# Patient Record
Sex: Female | Born: 1980 | Race: Black or African American | Hispanic: No | Marital: Married | Smoking: Never smoker
Health system: Southern US, Community
[De-identification: ages and names within clinical notes are randomized; demographics above are authoritative.]

## PROBLEM LIST (undated history)

## (undated) ENCOUNTER — Inpatient Hospital Stay (HOSPITAL_COMMUNITY): Payer: Self-pay

## (undated) DIAGNOSIS — O24419 Gestational diabetes mellitus in pregnancy, unspecified control: Secondary | ICD-10-CM

## (undated) HISTORY — DX: Gestational diabetes mellitus in pregnancy, unspecified control: O24.419

## (undated) HISTORY — PX: TUBAL LIGATION: SHX77

---

## 2009-02-01 ENCOUNTER — Ambulatory Visit (HOSPITAL_COMMUNITY): Admission: RE | Admit: 2009-02-01 | Discharge: 2009-02-01 | Payer: Self-pay | Admitting: Obstetrics & Gynecology

## 2009-04-20 ENCOUNTER — Ambulatory Visit (HOSPITAL_COMMUNITY): Admission: RE | Admit: 2009-04-20 | Discharge: 2009-04-20 | Payer: Self-pay | Admitting: Obstetrics & Gynecology

## 2009-06-12 ENCOUNTER — Inpatient Hospital Stay (HOSPITAL_COMMUNITY): Admission: AD | Admit: 2009-06-12 | Discharge: 2009-06-14 | Payer: Self-pay | Admitting: Obstetrics & Gynecology

## 2009-06-12 ENCOUNTER — Inpatient Hospital Stay (HOSPITAL_COMMUNITY): Admission: AD | Admit: 2009-06-12 | Discharge: 2009-06-12 | Payer: Self-pay | Admitting: Obstetrics & Gynecology

## 2010-08-21 ENCOUNTER — Encounter: Payer: Self-pay | Admitting: Obstetrics & Gynecology

## 2010-11-01 LAB — CBC
HCT: 33.7 % — ABNORMAL LOW (ref 36.0–46.0)
Hemoglobin: 12.6 g/dL (ref 12.0–15.0)
MCHC: 33.5 g/dL (ref 30.0–36.0)
MCV: 91.6 fL (ref 78.0–100.0)
RBC: 3.68 MIL/uL — ABNORMAL LOW (ref 3.87–5.11)
RBC: 4.13 MIL/uL (ref 3.87–5.11)
WBC: 11 10*3/uL — ABNORMAL HIGH (ref 4.0–10.5)

## 2011-07-31 NOTE — L&D Delivery Note (Signed)
Delivery Note At 11:50 AM a viable female was delivered via Vaginal, Spontaneous Delivery (Presentation: Right Occiput Anterior).  APGAR: 8, 9; weight 7 lb 14.3 oz (3580 g).   Placenta status: Intact, Spontaneous.  Cord: 3 vessels with the following complications: None.  Cord pH: none  Anesthesia: None  Episiotomy: Median Lacerations: 2nd Degree Suture Repair: 2-0 Chromic Est. Blood Loss (mL): 350  Mom to postpartum.  Baby to nursery-stable.  Aryiana Klinkner A 02/21/2012, 12:28 PM

## 2011-10-17 ENCOUNTER — Other Ambulatory Visit: Payer: Self-pay | Admitting: Obstetrics & Gynecology

## 2011-10-17 DIAGNOSIS — IMO0002 Reserved for concepts with insufficient information to code with codable children: Secondary | ICD-10-CM

## 2011-10-20 ENCOUNTER — Encounter: Payer: BC Managed Care – PPO | Admitting: Family Medicine

## 2011-10-20 NOTE — Progress Notes (Signed)
This encounter was created in error - please disregard.

## 2011-10-20 NOTE — Patient Instructions (Signed)

## 2011-10-20 NOTE — Progress Notes (Signed)
This 31 year old gentleman was seen for a physical exam yesterday and we noted a cyst on his right occiput. He's had this for about 9 months to year. It's nontender but bothersome and he would like it removed.  Objective: 2 cm round fluctuant elevated mass in the right occiput which is nontender.

## 2011-10-23 ENCOUNTER — Ambulatory Visit (HOSPITAL_COMMUNITY)
Admission: RE | Admit: 2011-10-23 | Discharge: 2011-10-23 | Disposition: A | Discharge status: Home / Self Care | Payer: Managed Care, Other (non HMO) | Source: Ambulatory Visit | Attending: Obstetrics & Gynecology | Admitting: Obstetrics & Gynecology

## 2011-10-23 ENCOUNTER — Encounter (HOSPITAL_COMMUNITY): Payer: Self-pay

## 2011-10-23 VITALS — BP 100/68 | HR 92 | Wt 147.0 lb

## 2011-10-23 DIAGNOSIS — Z363 Encounter for antenatal screening for malformations: Secondary | ICD-10-CM | POA: Insufficient documentation

## 2011-10-23 DIAGNOSIS — Z1389 Encounter for screening for other disorder: Secondary | ICD-10-CM | POA: Insufficient documentation

## 2011-10-23 DIAGNOSIS — O358XX0 Maternal care for other (suspected) fetal abnormality and damage, not applicable or unspecified: Secondary | ICD-10-CM | POA: Insufficient documentation

## 2011-10-23 DIAGNOSIS — IMO0002 Reserved for concepts with insufficient information to code with codable children: Secondary | ICD-10-CM

## 2011-10-23 NOTE — Progress Notes (Signed)
Obstetric ultrasound performed today.    The previously noted choriod plexus cysts appear to have resolved on today's exam.  This was discussed with the patient.  She did not have screening for fetal aneuploidy during this gestation.    Amniotic fluid volume is at the upper limits of normal.  No fetal findings associated with polyhydramnios are noted.   Fetal growth and the remainder of fetal anatomic survey are reassuring.    Repeat ultrasound recommended in 4 weeks to re evaluate fetal growth, anatomy, and amniotic fluid volume.  We would be pleased to perform this exam.  If desired, please call to schedule

## 2011-10-24 ENCOUNTER — Ambulatory Visit (HOSPITAL_COMMUNITY): Payer: Self-pay

## 2011-12-03 ENCOUNTER — Other Ambulatory Visit: Payer: Self-pay | Admitting: Obstetrics & Gynecology

## 2011-12-03 DIAGNOSIS — O358XX0 Maternal care for other (suspected) fetal abnormality and damage, not applicable or unspecified: Secondary | ICD-10-CM

## 2011-12-04 ENCOUNTER — Ambulatory Visit (HOSPITAL_COMMUNITY)
Admission: RE | Admit: 2011-12-04 | Discharge: 2011-12-04 | Disposition: A | Discharge status: Home / Self Care | Payer: BC Managed Care – PPO | Source: Ambulatory Visit | Attending: Obstetrics & Gynecology | Admitting: Obstetrics & Gynecology

## 2011-12-04 DIAGNOSIS — Z3689 Encounter for other specified antenatal screening: Secondary | ICD-10-CM | POA: Insufficient documentation

## 2011-12-04 DIAGNOSIS — O358XX0 Maternal care for other (suspected) fetal abnormality and damage, not applicable or unspecified: Secondary | ICD-10-CM

## 2011-12-28 ENCOUNTER — Other Ambulatory Visit: Payer: Self-pay | Admitting: Obstetrics & Gynecology

## 2011-12-28 DIAGNOSIS — Z09 Encounter for follow-up examination after completed treatment for conditions other than malignant neoplasm: Secondary | ICD-10-CM

## 2012-01-01 ENCOUNTER — Ambulatory Visit (HOSPITAL_COMMUNITY)
Admission: RE | Admit: 2012-01-01 | Discharge: 2012-01-01 | Disposition: A | Discharge status: Home / Self Care | Payer: BC Managed Care – PPO | Source: Ambulatory Visit | Attending: Obstetrics & Gynecology | Admitting: Obstetrics & Gynecology

## 2012-01-01 VITALS — BP 111/69 | HR 89 | Wt 158.0 lb

## 2012-01-01 DIAGNOSIS — Z3689 Encounter for other specified antenatal screening: Secondary | ICD-10-CM | POA: Insufficient documentation

## 2012-01-01 DIAGNOSIS — Z09 Encounter for follow-up examination after completed treatment for conditions other than malignant neoplasm: Secondary | ICD-10-CM

## 2012-01-01 DIAGNOSIS — O358XX0 Maternal care for other (suspected) fetal abnormality and damage, not applicable or unspecified: Secondary | ICD-10-CM | POA: Insufficient documentation

## 2012-01-01 NOTE — Progress Notes (Signed)
Patient seen today  for follow up ultrasound.  See full report in [facility].  Alpha Gula, MD  IUP at 33+2 weeks Appropriate interval growth with EFW at the 44th %tile   Normal amniotic fluid volume  Recommend follow up ultrasound as clinically indicated

## 2012-01-01 NOTE — ED Notes (Signed)
Pt denies problems to day. States + FM.

## 2012-02-21 ENCOUNTER — Encounter (HOSPITAL_COMMUNITY): Payer: Self-pay | Admitting: *Deleted

## 2012-02-21 ENCOUNTER — Inpatient Hospital Stay (HOSPITAL_COMMUNITY)
Admission: AD | Admit: 2012-02-21 | Discharge: 2012-02-23 | DRG: 373 | Disposition: A | Discharge status: Home / Self Care | Payer: BC Managed Care – PPO | Source: Ambulatory Visit | Attending: Obstetrics | Admitting: Obstetrics

## 2012-02-21 LAB — CBC
MCH: 28.3 pg (ref 26.0–34.0)
MCHC: 33.1 g/dL (ref 30.0–36.0)
MCV: 85.4 fL (ref 78.0–100.0)
Platelets: 189 10*3/uL (ref 150–400)
RDW: 15.7 % — ABNORMAL HIGH (ref 11.5–15.5)
WBC: 7.5 10*3/uL (ref 4.0–10.5)

## 2012-02-21 LAB — OB RESULTS CONSOLE GC/CHLAMYDIA
Chlamydia: NEGATIVE
Gonorrhea: NEGATIVE

## 2012-02-21 LAB — OB RESULTS CONSOLE ABO/RH: RH Type: POSITIVE

## 2012-02-21 LAB — ABO/RH: ABO/RH(D): O POS

## 2012-02-21 LAB — OB RESULTS CONSOLE RPR: RPR: NONREACTIVE

## 2012-02-21 MED ORDER — CITRIC ACID-SODIUM CITRATE 334-500 MG/5ML PO SOLN: 30.0000 mL | ORAL | Status: DC | PRN

## 2012-02-21 MED ORDER — MEDROXYPROGESTERONE ACETATE 150 MG/ML IM SUSP: 150.0000 mg | INTRAMUSCULAR | Status: DC | PRN

## 2012-02-21 MED ORDER — ZOLPIDEM TARTRATE 5 MG PO TABS: 5.0000 mg | ORAL_TABLET | Freq: Every evening | ORAL | Status: DC | PRN

## 2012-02-21 MED ORDER — ONDANSETRON HCL 4 MG/2ML IJ SOLN: 4.0000 mg | Freq: Four times a day (QID) | INTRAMUSCULAR | Status: DC | PRN

## 2012-02-21 MED ORDER — FLEET ENEMA 7-19 GM/118ML RE ENEM: 1.0000 | ENEMA | RECTAL | Status: DC | PRN

## 2012-02-21 MED ORDER — LIDOCAINE HCL (PF) 1 % IJ SOLN: 30.0000 mL | INTRAMUSCULAR | Status: DC | PRN

## 2012-02-21 MED ORDER — OXYTOCIN 40 UNITS IN LACTATED RINGERS INFUSION - SIMPLE MED
62.5000 mL/h | Freq: Once | INTRAVENOUS | Status: AC
  Administered 2012-02-21: 999 mL/h via INTRAVENOUS
  Filled 2012-02-21: qty 1000

## 2012-02-21 MED ORDER — OXYTOCIN 40 UNITS IN LACTATED RINGERS INFUSION - SIMPLE MED: 62.5000 mL/h | Freq: Once | INTRAVENOUS | Status: DC

## 2012-02-21 MED ORDER — LANOLIN HYDROUS EX OINT: TOPICAL_OINTMENT | CUTANEOUS | Status: DC | PRN

## 2012-02-21 MED ORDER — OXYTOCIN BOLUS FROM INFUSION: 250.0000 mL | Freq: Once | INTRAVENOUS | Status: DC

## 2012-02-21 MED ORDER — ONDANSETRON HCL 4 MG PO TABS: 4.0000 mg | ORAL_TABLET | ORAL | Status: DC | PRN

## 2012-02-21 MED ORDER — TETANUS-DIPHTH-ACELL PERTUSSIS 5-2.5-18.5 LF-MCG/0.5 IM SUSP
0.5000 mL | Freq: Once | INTRAMUSCULAR | Status: DC
  Filled 2012-02-21: qty 0.5

## 2012-02-21 MED ORDER — IBUPROFEN 600 MG PO TABS: 600.0000 mg | ORAL_TABLET | Freq: Four times a day (QID) | ORAL | Status: DC | PRN

## 2012-02-21 MED ORDER — OXYTOCIN BOLUS FROM INFUSION
250.0000 mL | Freq: Once | INTRAVENOUS | Status: DC
  Filled 2012-02-21: qty 500

## 2012-02-21 MED ORDER — ACETAMINOPHEN 325 MG PO TABS: 650.0000 mg | ORAL_TABLET | ORAL | Status: DC | PRN

## 2012-02-21 MED ORDER — NALBUPHINE SYRINGE 5 MG/0.5 ML
10.0000 mg | INJECTION | Freq: Four times a day (QID) | INTRAMUSCULAR | Status: DC | PRN
  Administered 2012-02-21: 10 mg via INTRAVENOUS
  Filled 2012-02-21 (×2): qty 1

## 2012-02-21 MED ORDER — LIDOCAINE HCL (PF) 1 % IJ SOLN
30.0000 mL | INTRAMUSCULAR | Status: DC | PRN
  Filled 2012-02-21: qty 30

## 2012-02-21 MED ORDER — NALOXONE HCL 0.4 MG/ML IJ SOLN
INTRAMUSCULAR | Status: AC
  Filled 2012-02-21: qty 1

## 2012-02-21 MED ORDER — NALBUPHINE HCL 10 MG/ML IJ SOLN
10.0000 mg | Freq: Four times a day (QID) | INTRAMUSCULAR | Status: DC | PRN
  Filled 2012-02-21: qty 1

## 2012-02-21 MED ORDER — NALBUPHINE SYRINGE 5 MG/0.5 ML
10.0000 mg | INJECTION | Freq: Four times a day (QID) | INTRAMUSCULAR | Status: DC | PRN
  Filled 2012-02-21: qty 1

## 2012-02-21 MED ORDER — LACTATED RINGERS IV SOLN: INTRAVENOUS | Status: DC

## 2012-02-21 MED ORDER — BENZOCAINE-MENTHOL 20-0.5 % EX AERO
1.0000 "application " | INHALATION_SPRAY | CUTANEOUS | Status: DC | PRN
  Administered 2012-02-21: 1 via TOPICAL
  Filled 2012-02-21: qty 56

## 2012-02-21 MED ORDER — LACTATED RINGERS IV SOLN: 500.0000 mL | INTRAVENOUS | Status: DC | PRN

## 2012-02-21 MED ORDER — NALBUPHINE HCL 10 MG/ML IJ SOLN
10.0000 mg | Freq: Four times a day (QID) | INTRAMUSCULAR | Status: DC | PRN
  Administered 2012-02-21: 10 mg via INTRAVENOUS
  Filled 2012-02-21 (×2): qty 1

## 2012-02-21 MED ORDER — DIBUCAINE 1 % RE OINT: 1.0000 "application " | TOPICAL_OINTMENT | RECTAL | Status: DC | PRN

## 2012-02-21 MED ORDER — WITCH HAZEL-GLYCERIN EX PADS: 1.0000 "application " | MEDICATED_PAD | CUTANEOUS | Status: DC | PRN

## 2012-02-21 MED ORDER — PRENATAL MULTIVITAMIN CH
1.0000 | ORAL_TABLET | Freq: Every day | ORAL | Status: DC
  Administered 2012-02-21 – 2012-02-23 (×3): 1 via ORAL
  Filled 2012-02-21 (×2): qty 1

## 2012-02-21 MED ORDER — OXYTOCIN 40 UNITS IN LACTATED RINGERS INFUSION - SIMPLE MED: 62.5000 mL/h | INTRAVENOUS | Status: DC | PRN

## 2012-02-21 MED ORDER — OXYCODONE-ACETAMINOPHEN 5-325 MG PO TABS
1.0000 | ORAL_TABLET | ORAL | Status: DC | PRN
Start: 2012-02-21 — End: 2012-02-21

## 2012-02-21 MED ORDER — DIPHENHYDRAMINE HCL 25 MG PO CAPS: 25.0000 mg | ORAL_CAPSULE | Freq: Four times a day (QID) | ORAL | Status: DC | PRN

## 2012-02-21 MED ORDER — OXYCODONE-ACETAMINOPHEN 5-325 MG PO TABS
1.0000 | ORAL_TABLET | ORAL | Status: DC | PRN
  Administered 2012-02-21 (×2): 1 via ORAL
  Administered 2012-02-22: 2 via ORAL
  Administered 2012-02-22 (×2): 1 via ORAL
  Filled 2012-02-21 (×2): qty 1
  Filled 2012-02-21: qty 2
  Filled 2012-02-21 (×2): qty 1

## 2012-02-21 MED ORDER — LACTATED RINGERS IV SOLN
INTRAVENOUS | Status: DC
  Administered 2012-02-21: 09:00:00 via INTRAVENOUS

## 2012-02-21 MED ORDER — SENNOSIDES-DOCUSATE SODIUM 8.6-50 MG PO TABS
2.0000 | ORAL_TABLET | Freq: Every day | ORAL | Status: DC
  Administered 2012-02-21 – 2012-02-22 (×2): 2 via ORAL

## 2012-02-21 MED ORDER — OXYCODONE-ACETAMINOPHEN 5-325 MG PO TABS: 1.0000 | ORAL_TABLET | ORAL | Status: DC | PRN

## 2012-02-21 MED ORDER — ONDANSETRON HCL 4 MG/2ML IJ SOLN: 4.0000 mg | INTRAMUSCULAR | Status: DC | PRN

## 2012-02-21 MED ORDER — IBUPROFEN 600 MG PO TABS
600.0000 mg | ORAL_TABLET | Freq: Four times a day (QID) | ORAL | Status: DC
  Administered 2012-02-21 – 2012-02-23 (×8): 600 mg via ORAL
  Filled 2012-02-21 (×8): qty 1

## 2012-02-21 MED ORDER — SIMETHICONE 80 MG PO CHEW: 80.0000 mg | CHEWABLE_TABLET | ORAL | Status: DC | PRN

## 2012-02-21 NOTE — Progress Notes (Signed)
Annette Lambert is a 31 y.o. [id number] at [redacted]w[redacted]d by LMP admitted for active labor  Subjective:   Objective: BP 111/70  Pulse 79  Temp 98.3 F (36.8 C) (Oral)  Resp 20  Ht 5\' 7"  (1.702 m)  Wt 74.753 kg (164 lb 12.8 oz)  BMI 25.81 kg/m2  SpO2 100%  LMP 05/13/2011      FHT:  FHR: 150 bpm, variability: moderate,  accelerations:  Present,  decelerations:  Absent UC:   regular, every 6 minutes SVE:   Dilation: 5 Effacement (%): 70 Station: -2 Exam by:: [employer]: Lab Results  Component Value Date   WBC 7.5 02/21/2012   HGB 11.2* 02/21/2012   HCT 33.8* 02/21/2012   MCV 85.4 02/21/2012   PLT 189 02/21/2012    Assessment / Plan: Spontaneous labor, progressing normally  Labor: Progressing normally Preeclampsia:  n/a Fetal Wellbeing:  Category I Pain Control:  Nubain I/D:  n/a Anticipated MOD:  NSVD  HARPER,CHARLES A 02/21/2012, 10:07 AM

## 2012-02-21 NOTE — Progress Notes (Signed)
Jerlean Peralta is a 31 y.o. [id number] at [redacted]w[redacted]d by LMP admitted for active labor  Subjective:   Objective: BP 104/56  Pulse 64  Temp 98.3 F (36.8 C) (Oral)  Resp 20  Ht 5\' 7"  (1.702 m)  Wt 74.753 kg (164 lb 12.8 oz)  BMI 25.81 kg/m2  SpO2 100%  LMP 05/13/2011      FHT:  FHR: 130 bpm, variability: moderate,  accelerations:  Present,  decelerations:  Present variables UC:   regular, every 2-3 minutes SVE:   Dilation: 10 Effacement (%): 70 Station: +1 Exam by:: Raliegh Ip RN  Labs: Lab Results  Component Value Date   WBC 7.5 02/21/2012   HGB 11.2* 02/21/2012   HCT 33.8* 02/21/2012   MCV 85.4 02/21/2012   PLT 189 02/21/2012    Assessment / Plan: Spontaneous labor, progressing normally  Labor: Progressing normally Preeclampsia:  n/a Fetal Wellbeing:  Category I Pain Control:  Nubain I/D:  n/a Anticipated MOD:  NSVD  Sitlali Koerner A 02/21/2012, 11:37 AM

## 2012-02-21 NOTE — H&P (Signed)
Annette Lambert is a 31 y.o. female presenting for UC's. Maternal Medical History:  Reason for admission: Reason for admission: contractions.  Contractions: Frequency: regular.    Fetal activity: Perceived fetal activity is normal.   Last perceived fetal movement was within the past hour.    Prenatal complications: no prenatal complications   OB History    Grav Para Term Preterm Abortions TAB SAB Ect Mult Living   4 2 2  0 1 0 1 0 0 2     Past Medical History  Diagnosis Date  . No pertinent past medical history    Past Surgical History  Procedure Date  . No past surgeries    Family History: family history is not on file. Social History:  reports that she has never smoked. She has never used smokeless tobacco. She reports that she does not drink alcohol or use illicit drugs.   Prenatal Transfer Tool  Maternal Diabetes: No Genetic Screening: Declined Maternal Ultrasounds/Referrals: Normal Fetal Ultrasounds or other Referrals:  None Maternal Substance Abuse:  No Significant Maternal Medications:  Meds include: Other: see prenatal record Significant Maternal Lab Results:  Lab values include: Group B Strep negative Other Comments:  None  Review of Systems  All other systems reviewed and are negative.    Dilation: 8 Effacement (%): 80 Station: -1 Exam by:: Lupita Leash, RN Blood pressure 117/66, pulse 85, temperature 97.2 F (36.2 C), temperature source Oral, resp. rate 18, height 5\' 7"  (1.702 m), weight 74.753 kg (164 lb 12.8 oz), last menstrual period 05/13/2011, SpO2 100.00%. Maternal Exam:  Uterine Assessment: Contraction strength is firm.  Abdomen: Patient reports no abdominal tenderness. Fetal presentation: vertex  Pelvis: adequate for delivery.   Cervix: Cervix evaluated by digital exam.     Physical Exam  Nursing note and vitals reviewed. Constitutional: She is oriented to person, place, and time. She appears well-developed and well-nourished.  HENT:  Head:  Normocephalic and atraumatic.  Eyes: Conjunctivae are normal. Pupils are equal, round, and reactive to light.  Neck: Normal range of motion. Neck supple.  Cardiovascular: Normal rate and regular rhythm.   Respiratory: Effort normal and breath sounds normal.  GI: Soft.  Musculoskeletal: Normal range of motion.  Neurological: She is alert and oriented to person, place, and time.  Skin: Skin is warm and dry.  Psychiatric: She has a normal mood and affect. Her behavior is normal. Judgment and thought content normal.    Prenatal labs: ABO, Rh:   Antibody:   Rubella:   RPR:    HBsAg:    HIV:    GBS:     Assessment/Plan: 40.4 weeks.  Active labor.  Expectant management.   Amelya Mabry A 02/21/2012, 8:33 AM

## 2012-02-22 LAB — CBC
Hemoglobin: 10.4 g/dL — ABNORMAL LOW (ref 12.0–15.0)
MCH: 28.4 pg (ref 26.0–34.0)
Platelets: 189 10*3/uL (ref 150–400)
RBC: 3.66 MIL/uL — ABNORMAL LOW (ref 3.87–5.11)
WBC: 10 10*3/uL (ref 4.0–10.5)

## 2012-02-22 NOTE — Progress Notes (Signed)
Post Partum Day 1 Subjective: no complaints  Objective: Blood pressure 94/65, pulse 74, temperature 97.4 F (36.3 C), temperature source Oral, resp. rate 18, height 5\' 7"  (1.702 m), weight 74.753 kg (164 lb 12.8 oz), last menstrual period 05/13/2011, SpO2 100.00%, unknown if currently breastfeeding.  Physical Exam:  General: alert and no distress Lochia: appropriate Uterine Fundus: firm Incision: none DVT Evaluation: No evidence of DVT seen on physical exam.   Basename 02/22/12 0500 02/21/12 0835  HGB 10.4* 11.2*  HCT 31.8* 33.8*    Assessment/Plan: Plan for discharge tomorrow   LOS: 1 day   Annette Lambert A 02/22/2012, 3:24 PM

## 2012-02-23 MED ORDER — OXYCODONE-ACETAMINOPHEN 5-325 MG PO TABS: 1.0000 | ORAL_TABLET | Freq: Four times a day (QID) | ORAL | Status: AC | PRN

## 2012-02-23 MED ORDER — MEDROXYPROGESTERONE ACETATE 150 MG/ML IM SUSP: 150.0000 mg | Freq: Once | INTRAMUSCULAR | Status: DC

## 2012-02-23 NOTE — Discharge Summary (Signed)
  Obstetric Discharge Summary Reason for Admission: onset of labor Prenatal Procedures: none Intrapartum Procedures: spontaneous vaginal delivery Postpartum Procedures: none Complications-Operative and Postpartum: none  Hemoglobin  Date Value Range Status  02/22/2012 10.4* 12.0 - 15.0 g/dL Final     HCT  Date Value Range Status  02/22/2012 31.8* 36.0 - 46.0 % Final    Physical Exam:  General: alert Lochia: appropriate Uterine: firm Incision: n/a DVT Evaluation: No evidence of DVT seen on physical exam.  Discharge Diagnoses: Active Problems:  Normal delivery   Discharge Information: Date: 02/23/2012 Activity: pelvic rest Diet: routine Medications:  Prior to Admission medications   Medication Sig Start Date End Date Taking? Authorizing Provider  Prenatal Vit-Fe Fumarate-FA (PRENATAL MULTIVITAMIN) TABS Take 1 tablet by mouth daily.   Yes Historical Provider, MD  oxyCODONE-acetaminophen (PERCOCET/ROXICET) 5-325 MG per tablet Take 1-2 tablets by mouth every 6 (six) hours as needed (moderate - severe pain). 02/23/12 03/04/12  Antionette Char, MD    Condition: stable Instructions: refer to routine discharge instructions Discharge to: home Follow-up Information    Follow up with Antionette Char A, MD. Schedule an appointment as soon as possible for a visit in 6 weeks.   Contact information:   [address] [place] [address] [phone]          Newborn Data: Live born  Information for the patient's newborn:  Laurianne, Floresca [[id number]]  female ; APGAR , ; weight ;  Home with mother.  JACKSON-MOORE,Valeria Krisko A 02/23/2012, 11:30 AM

## 2012-11-10 ENCOUNTER — Ambulatory Visit: Payer: Self-pay

## 2012-11-12 ENCOUNTER — Ambulatory Visit: Payer: Self-pay

## 2012-11-19 ENCOUNTER — Ambulatory Visit (INDEPENDENT_AMBULATORY_CARE_PROVIDER_SITE_OTHER): Payer: BC Managed Care – PPO | Admitting: *Deleted

## 2012-11-19 DIAGNOSIS — Z3202 Encounter for pregnancy test, result negative: Secondary | ICD-10-CM

## 2012-11-19 DIAGNOSIS — IMO0001 Reserved for inherently not codable concepts without codable children: Secondary | ICD-10-CM

## 2012-11-19 LAB — POCT URINE PREGNANCY: Preg Test, Ur: NEGATIVE

## 2012-11-19 NOTE — Progress Notes (Signed)
Patient is late for her injection- she was due 11/06/2012. UPT today and patient to abstain for 2 weeks with repeat and shot. Explained importance of being on time for injection.

## 2012-12-02 ENCOUNTER — Ambulatory Visit (INDEPENDENT_AMBULATORY_CARE_PROVIDER_SITE_OTHER): Payer: BC Managed Care – PPO | Admitting: *Deleted

## 2012-12-02 VITALS — BP 108/71 | HR 83 | Temp 98.7°F | Wt 151.0 lb

## 2012-12-02 DIAGNOSIS — Z309 Encounter for contraceptive management, unspecified: Secondary | ICD-10-CM

## 2012-12-02 DIAGNOSIS — Z3202 Encounter for pregnancy test, result negative: Secondary | ICD-10-CM

## 2012-12-02 LAB — POCT URINE PREGNANCY: Preg Test, Ur: NEGATIVE

## 2012-12-02 MED ORDER — MEDROXYPROGESTERONE ACETATE 150 MG/ML IM SUSP
150.0000 mg | INTRAMUSCULAR | Status: AC
  Administered 2012-12-02 – 2013-08-10 (×3): 150 mg via INTRAMUSCULAR

## 2012-12-02 NOTE — Progress Notes (Signed)
Pt in the office today for a second upt and the Depo injection. Pt states she has not had intercourse in the last two weeks.

## 2013-02-23 ENCOUNTER — Ambulatory Visit (INDEPENDENT_AMBULATORY_CARE_PROVIDER_SITE_OTHER): Payer: BC Managed Care – PPO | Admitting: *Deleted

## 2013-02-23 VITALS — BP 108/70 | HR 73 | Temp 98.5°F | Wt 159.0 lb

## 2013-02-23 DIAGNOSIS — Z3049 Encounter for surveillance of other contraceptives: Secondary | ICD-10-CM

## 2013-02-23 NOTE — Progress Notes (Signed)
Pt in office for Depo injection. Pt is on time for her Depo. Injection administered. Pt tolerated injection well. Pt is due back for her next injection 05-17-13.

## 2013-05-18 ENCOUNTER — Other Ambulatory Visit: Payer: Self-pay | Admitting: Obstetrics & Gynecology

## 2013-05-18 ENCOUNTER — Ambulatory Visit (INDEPENDENT_AMBULATORY_CARE_PROVIDER_SITE_OTHER): Payer: BC Managed Care – PPO | Admitting: *Deleted

## 2013-05-18 VITALS — BP 116/77 | HR 66 | Wt 161.0 lb

## 2013-05-18 DIAGNOSIS — Z3049 Encounter for surveillance of other contraceptives: Secondary | ICD-10-CM

## 2013-05-19 NOTE — Progress Notes (Signed)
Pt in office today for a Depo injection. Pt is one time for her injection. Pt to return to office 08-09-13. Pt tolerated injection well.

## 2013-08-10 ENCOUNTER — Ambulatory Visit (INDEPENDENT_AMBULATORY_CARE_PROVIDER_SITE_OTHER): Payer: 59 | Admitting: *Deleted

## 2013-08-10 ENCOUNTER — Other Ambulatory Visit: Payer: Self-pay | Admitting: *Deleted

## 2013-08-10 VITALS — BP 111/76 | HR 77 | Temp 98.1°F | Wt 158.6 lb

## 2013-08-10 DIAGNOSIS — Z309 Encounter for contraceptive management, unspecified: Secondary | ICD-10-CM

## 2013-08-10 DIAGNOSIS — IMO0001 Reserved for inherently not codable concepts without codable children: Secondary | ICD-10-CM

## 2013-08-10 MED ORDER — MEDROXYPROGESTERONE ACETATE 150 MG/ML IM SUSP: INTRAMUSCULAR | Status: DC

## 2013-08-12 ENCOUNTER — Ambulatory Visit: Payer: BC Managed Care – PPO

## 2013-10-05 ENCOUNTER — Ambulatory Visit: Payer: 59

## 2014-10-26 ENCOUNTER — Ambulatory Visit (INDEPENDENT_AMBULATORY_CARE_PROVIDER_SITE_OTHER): Payer: 59 | Admitting: Family Medicine

## 2014-10-26 VITALS — BP 102/60 | HR 105 | Temp 103.1°F | Resp 18 | Ht 67.25 in | Wt 153.0 lb

## 2014-10-26 DIAGNOSIS — R059 Cough, unspecified: Secondary | ICD-10-CM

## 2014-10-26 DIAGNOSIS — R509 Fever, unspecified: Secondary | ICD-10-CM

## 2014-10-26 DIAGNOSIS — R05 Cough: Secondary | ICD-10-CM | POA: Diagnosis not present

## 2014-10-26 DIAGNOSIS — J101 Influenza due to other identified influenza virus with other respiratory manifestations: Secondary | ICD-10-CM | POA: Diagnosis not present

## 2014-10-26 LAB — POCT INFLUENZA A/B
Influenza A, POC: POSITIVE
Influenza B, POC: NEGATIVE

## 2014-10-26 MED ORDER — OSELTAMIVIR PHOSPHATE 75 MG PO CAPS: 75.0000 mg | ORAL_CAPSULE | Freq: Two times a day (BID) | ORAL | Status: DC

## 2014-10-26 NOTE — Progress Notes (Addendum)
Subjective:   This chart was scribed for Annette StaggersJeffrey Jason Hauge, MD by Jarvis Morganaylor Ferguson, Medical Scribe. This patient was seen in Room 4 and the patient's care was started at 9:05 PM.   Patient ID: Annette MooreSahar Lambert, female    DOB: 09/07/1980, 34 y.o.   MRN: [id number]  Chief Complaint  Patient presents with  . Chills    started yesterday  . Fever  . Headache  . Cough    HPI  Annette MooreSahar Burress is a 34 y.o. female   She presents with intermittent chills that began yesterday. She is having associated fever (t-max 103.69F), cough, myalgias, HA. She denies getting a flu vaccine this year. She took Ibuprofen and Aleve with no relief. She denies any recent travel or sick contacts. Pt last urinated 2 hours ago. She denies any other issues at this time.   PCP: No primary care provider on file.  Patient Active Problem List   Diagnosis Date Noted  . Normal delivery 02/23/2012   Past Medical History  Diagnosis Date  . No pertinent past medical history    Past Surgical History  Procedure Laterality Date  . No past surgeries     No Known Allergies Prior to Admission medications   Medication Sig Start Date End Date Taking? Authorizing Provider  medroxyPROGESTERone (DEPO-PROVERA) 150 MG/ML injection Patient to bring to office for injection. 08/10/13  Yes Antionette CharLisa Jackson-Moore, MD   History   Social History  . Marital Status: Married    Spouse Name: N/A  . Number of Children: N/A  . Years of Education: N/A   Occupational History  . Not on file.   Social History Main Topics  . Smoking status: Never Smoker   . Smokeless tobacco: Never Used  . Alcohol Use: No  . Drug Use: No  . Sexual Activity: Yes    Birth Control/ Protection: Injection   Other Topics Concern  . Not on file   Social History Narrative    Review of Systems  Constitutional: Positive for fever and chills.  Respiratory: Positive for cough.   Genitourinary: Negative.   Musculoskeletal: Positive for myalgias.  Neurological:  Positive for headaches.       Objective:   Physical Exam  Constitutional: She is oriented to person, place, and time. She appears well-developed and well-nourished. No distress.  HENT:  Head: Normocephalic and atraumatic.  Right Ear: Hearing, tympanic membrane, external ear and ear canal normal.  Left Ear: Hearing, tympanic membrane, external ear and ear canal normal.  Nose: Nose normal.  Mouth/Throat: Oropharynx is clear and moist and mucous membranes are normal. No oropharyngeal exudate.  Moist oral mucosa  Eyes: Conjunctivae and EOM are normal. Pupils are equal, round, and reactive to light.  Cardiovascular: Normal rate, regular rhythm, normal heart sounds and intact distal pulses.   No murmur heard. Pulmonary/Chest: Effort normal and breath sounds normal. No respiratory distress. She has no wheezes. She has no rhonchi.  Neurological: She is alert and oriented to person, place, and time.  Skin: Skin is warm and dry. No rash noted.  Psychiatric: She has a normal mood and affect. Her behavior is normal.  Vitals reviewed.   Filed Vitals:   10/26/14 2031  BP: 102/60  Pulse: 105  Temp: 103.1 F (39.5 C)  TempSrc: Oral  Resp: 18  Height: 5' 7.25" (1.708 m)  Weight: 153 lb (69.4 kg)  SpO2: 97%   Results for orders placed or performed in visit on 10/26/14  POCT Influenza A/B  Result Value  Ref Range   Influenza A, POC Positive    Influenza B, POC Negative       Assessment & Plan:   Annette Lambert is a 34 y.o. female Other specified fever - Plan: POCT Influenza A/B  Cough - Plan: POCT Influenza A/B  Influenza A - Plan: oseltamivir (TAMIFLU) 75 MG capsule  Influenza. Sx care discussed. Offered tamiflu - rx given if she wants to fill this. Sx care discussed by AVS. RTC precautions given.   Meds ordered this encounter  Medications  . oseltamivir (TAMIFLU) 75 MG capsule    Sig: Take 1 capsule (75 mg total) by mouth 2 (two) times daily.    Dispense:  10 capsule    Refill:   0   Patient Instructions  Start tamiflu tonight if you are going to take it.  Saline nasal spray at least 4 times per day if needed for nasal congestion, over the counter mucinex or mucinex DM as needed for cough, tylenol or ibuprofen over the counter for fever and body aches, and drink plenty of fluids. Other information as in instructions below.  Return to the clinic or go to the nearest emergency room if any of your symptoms worsen or new symptoms occur.  Influenza Influenza ("the flu") is a viral infection of the respiratory tract. It occurs more often in winter months because people spend more time in close contact with one another. Influenza can make you feel very sick. Influenza easily spreads from person to person (contagious). CAUSES  Influenza is caused by a virus that infects the respiratory tract. You can catch the virus by breathing in droplets from an infected person's cough or sneeze. You can also catch the virus by touching something that was recently contaminated with the virus and then touching your mouth, nose, or eyes. RISKS AND COMPLICATIONS You may be at risk for a more severe case of influenza if you smoke cigarettes, have diabetes, have chronic heart disease (such as heart failure) or lung disease (such as asthma), or if you have a weakened immune system. Elderly people and pregnant women are also at risk for more serious infections. The most common problem of influenza is a lung infection (pneumonia). Sometimes, this problem can require emergency medical care and may be life threatening. SIGNS AND SYMPTOMS  Symptoms typically last 4 to 10 days and may include:  Fever.  Chills.  Headache, body aches, and muscle aches.  Sore throat.  Chest discomfort and cough.  Poor appetite.  Weakness or feeling tired.  Dizziness.  Nausea or vomiting. DIAGNOSIS  Diagnosis of influenza is often made based on your history and a physical exam. A nose or throat swab test can be  done to confirm the diagnosis. TREATMENT  In mild cases, influenza goes away on its own. Treatment is directed at relieving symptoms. For more severe cases, your health care provider may prescribe antiviral medicines to shorten the sickness. Antibiotic medicines are not effective because the infection is caused by a virus, not by bacteria. HOME CARE INSTRUCTIONS  Take medicines only as directed by your health care provider.  Use a cool mist humidifier to make breathing easier.  Get plenty of rest until your temperature returns to normal. This usually takes 3 to 4 days.  Drink enough fluid to keep your urine clear or pale yellow.  Cover yourmouth and nosewhen coughing or sneezing,and wash your handswellto prevent thevirusfrom spreading.  Stay homefromwork orschool untilthe fever is gonefor at least 23full day. PREVENTION  An  annual influenza vaccination (flu shot) is the best way to avoid getting influenza. An annual flu shot is now routinely recommended for all adults in the [place] SEEK MEDICAL CARE IF:  You experiencechest pain, yourcough worsens,or you producemore mucus.  Youhave nausea,vomiting, ordiarrhea.  Your fever returns or gets worse. SEEK IMMEDIATE MEDICAL CARE IF:  You havetrouble breathing, you become short of breath,or your skin ornails becomebluish.  You have severe painor stiffnessin the neck.  You develop a sudden headache, or pain in the face or ear.  You have nausea or vomiting that you cannot control. MAKE SURE YOU:   Understand these instructions.  Will watch your condition.  Will get help right away if you are not doing well or get worse. Document Released: 07/13/2000 Document Revised: 11/30/2013 Document Reviewed: 10/15/2011 [facility] Patient Information 2015 [place], [place]. This information is not intended to replace advice given to you by your health care provider. Make sure you discuss any questions you have with your health  care provider.    I personally performed the services described in this documentation, which was scribed in my presence. The recorded information has been reviewed and considered, and addended by me as needed.

## 2014-10-26 NOTE — Patient Instructions (Signed)
Start tamiflu tonight if you are going to take it.  Saline nasal spray at least 4 times per day if needed for nasal congestion, over the counter mucinex or mucinex DM as needed for cough, tylenol or ibuprofen over the counter for fever and body aches, and drink plenty of fluids. Other information as in instructions below.  Return to the clinic or go to the nearest emergency room if any of your symptoms worsen or new symptoms occur.  Influenza Influenza ("the flu") is a viral infection of the respiratory tract. It occurs more often in winter months because people spend more time in close contact with one another. Influenza can make you feel very sick. Influenza easily spreads from person to person (contagious). CAUSES  Influenza is caused by a virus that infects the respiratory tract. You can catch the virus by breathing in droplets from an infected person's cough or sneeze. You can also catch the virus by touching something that was recently contaminated with the virus and then touching your mouth, nose, or eyes. RISKS AND COMPLICATIONS You may be at risk for a more severe case of influenza if you smoke cigarettes, have diabetes, have chronic heart disease (such as heart failure) or lung disease (such as asthma), or if you have a weakened immune system. Elderly people and pregnant women are also at risk for more serious infections. The most common problem of influenza is a lung infection (pneumonia). Sometimes, this problem can require emergency medical care and may be life threatening. SIGNS AND SYMPTOMS  Symptoms typically last 4 to 10 days and may include:  Fever.  Chills.  Headache, body aches, and muscle aches.  Sore throat.  Chest discomfort and cough.  Poor appetite.  Weakness or feeling tired.  Dizziness.  Nausea or vomiting. DIAGNOSIS  Diagnosis of influenza is often made based on your history and a physical exam. A nose or throat swab test can be done to confirm the  diagnosis. TREATMENT  In mild cases, influenza goes away on its own. Treatment is directed at relieving symptoms. For more severe cases, your health care provider may prescribe antiviral medicines to shorten the sickness. Antibiotic medicines are not effective because the infection is caused by a virus, not by bacteria. HOME CARE INSTRUCTIONS  Take medicines only as directed by your health care provider.  Use a cool mist humidifier to make breathing easier.  Get plenty of rest until your temperature returns to normal. This usually takes 3 to 4 days.  Drink enough fluid to keep your urine clear or pale yellow.  Cover yourmouth and nosewhen coughing or sneezing,and wash your handswellto prevent thevirusfrom spreading.  Stay homefromwork orschool untilthe fever is gonefor at least 571full day. PREVENTION  An annual influenza vaccination (flu shot) is the best way to avoid getting influenza. An annual flu shot is now routinely recommended for all adults in the [place] SEEK MEDICAL CARE IF:  You experiencechest pain, yourcough worsens,or you producemore mucus.  Youhave nausea,vomiting, ordiarrhea.  Your fever returns or gets worse. SEEK IMMEDIATE MEDICAL CARE IF:  You havetrouble breathing, you become short of breath,or your skin ornails becomebluish.  You have severe painor stiffnessin the neck.  You develop a sudden headache, or pain in the face or ear.  You have nausea or vomiting that you cannot control. MAKE SURE YOU:   Understand these instructions.  Will watch your condition.  Will get help right away if you are not doing well or get worse. Document Released: 07/13/2000 Document  Revised: 11/30/2013 Document Reviewed: 10/15/2011 [facility] Patient Information 2015 [place], [place]. This information is not intended to replace advice given to you by your health care provider. Make sure you discuss any questions you have with your health care provider.

## 2014-11-15 ENCOUNTER — Ambulatory Visit (INDEPENDENT_AMBULATORY_CARE_PROVIDER_SITE_OTHER): Payer: 59 | Admitting: Emergency Medicine

## 2014-11-15 VITALS — BP 108/72 | HR 83 | Temp 98.0°F | Resp 16 | Ht 67.0 in | Wt 149.0 lb

## 2014-11-15 DIAGNOSIS — R509 Fever, unspecified: Secondary | ICD-10-CM | POA: Diagnosis not present

## 2014-11-15 DIAGNOSIS — R05 Cough: Secondary | ICD-10-CM | POA: Diagnosis not present

## 2014-11-15 DIAGNOSIS — R059 Cough, unspecified: Secondary | ICD-10-CM

## 2014-11-15 DIAGNOSIS — J209 Acute bronchitis, unspecified: Secondary | ICD-10-CM

## 2014-11-15 MED ORDER — BENZONATATE 100 MG PO CAPS: 100.0000 mg | ORAL_CAPSULE | Freq: Three times a day (TID) | ORAL | Status: DC | PRN

## 2014-11-15 MED ORDER — HYDROCODONE-HOMATROPINE 5-1.5 MG/5ML PO SYRP: 5.0000 mL | ORAL_SOLUTION | Freq: Every evening | ORAL | Status: DC | PRN

## 2014-11-15 MED ORDER — AZITHROMYCIN 500 MG PO TABS: 500.0000 mg | ORAL_TABLET | Freq: Every day | ORAL | Status: DC

## 2014-11-15 NOTE — Patient Instructions (Signed)

## 2014-11-15 NOTE — Progress Notes (Signed)
    MRN: [id number] DOB: 10/23/1980  Subjective:   Annette MooreSahar Lambert is a 34 y.o. female presenting for chief complaint of Fever and Cough  Reports 3 week history of intermittent subjective fever and ongoing dry cough worse in the morning. Patient has tried Tylenol for fever and cough with some relief, also uses ibuprofen. Denies Sinus congestion, rhinorrhea, sinus pain, itchy watery eyes, red eyes, ear fullness, ear pain, throat pain, wheezing, shortness of breath, chest tightness, chest pain and myalgia, fatigue, nausea, vomiting, abdominal pain and diarrhea. Denies history of seasonal allergies, history of asthma. Denies smoking or alcohol use. Denies any other aggravating or relieving factors, no other questions or concerns.  Annette Lambert has a current medication list which includes the following prescription(s): acetaminophen. She has No Known Allergies.  Annette Lambert  has a past medical history of No pertinent past medical history. Also  has past surgical history that includes No past surgeries.  ROS As in subjective.  Objective:   Vitals: BP 108/72 mmHg  Pulse 83  Temp(Src) 98 F (36.7 C) (Oral)  Resp 16  Ht 5\' 7"  (1.702 m)  Wt 149 lb (67.586 kg)  BMI 23.33 kg/m2  SpO2 100%  LMP 11/12/2014  Physical Exam  Constitutional: She is well-developed, well-nourished, and in no distress.  HENT:  TM's intact bilaterally, no effusions or erythema. Nares patent, nasal turbinates pink and moist. Mild right maxillary sinus tenderness. Oropharynx clear, mucous membranes moist, dentition in good repair.  Eyes: Conjunctivae are normal. Right eye exhibits no discharge. Left eye exhibits no discharge. No scleral icterus.  Cardiovascular: Normal rate, regular rhythm and intact distal pulses.  Exam reveals no gallop and no friction rub.   No murmur heard. Pulmonary/Chest: No stridor. No respiratory distress. She has no wheezes. She has rales (rhonchorous lung sounds R>L). She exhibits no tenderness.    Lymphadenopathy:    She has cervical adenopathy (bilateral, anterior, R>L).   Assessment and Plan :   1. Acute bronchitis, unspecified organism 2. Cough 3. Fever, unspecified fever cause - Start azithromycin x3 days, Hycodan and Tessalon for cough - Return to clinic in 1 day if symptoms have not resolved  Wallis BambergMario Mykah Shin, PA-C [facility] [phone] 11/15/2014 2:25 PM

## 2014-11-16 NOTE — Progress Notes (Signed)
  Medical screening examination/treatment/procedure(s) were performed by non-physician practitioner and as supervising physician I was immediately available for consultation/collaboration.     

## 2015-10-21 ENCOUNTER — Other Ambulatory Visit (INDEPENDENT_AMBULATORY_CARE_PROVIDER_SITE_OTHER): Payer: 59 | Admitting: *Deleted

## 2015-10-21 VITALS — BP 131/84 | HR 92 | Wt 162.0 lb

## 2015-10-21 DIAGNOSIS — Z3201 Encounter for pregnancy test, result positive: Secondary | ICD-10-CM

## 2015-10-21 DIAGNOSIS — Z3200 Encounter for pregnancy test, result unknown: Secondary | ICD-10-CM

## 2015-10-21 DIAGNOSIS — O219 Vomiting of pregnancy, unspecified: Secondary | ICD-10-CM

## 2015-10-21 LAB — POCT URINE PREGNANCY: PREG TEST UR: POSITIVE — AB

## 2015-10-21 MED ORDER — METOCLOPRAMIDE HCL 10 MG PO TABS: 10.0000 mg | ORAL_TABLET | Freq: Three times a day (TID) | ORAL | Status: DC

## 2015-10-21 NOTE — Addendum Note (Signed)
Addended by: Marya LandryFOSTER, Aysa Larivee D on: 10/21/2015 10:28 AM   Modules accepted: Orders

## 2015-10-21 NOTE — Progress Notes (Signed)
Given pt LMP, she is currently 7.[redacted] weeks gestation. Pt was made aware a NOB visit would be scheduled for her at 10-12 weeks.  Pt states that she has been having N&V.  Pt states that she has been taking an OTC nausea medication from pharmacy, does not know name. Pt states that it has not been helping much with the nausea. Reviewed with Dr Clearance CootsHarper, Reglan 10mg  TID was order and sent to pharmacy.  Pt was made aware to call office if any problems with Rx or if she has no relief of symptoms.   Pt was also made aware of any signs to seek emergent care. Pt was advised if any [facility] or call our office. Pt states understanding and has no other concerns today.

## 2015-10-21 NOTE — Progress Notes (Signed)
Pt is in office today for UPT. Pt states that she has had positive at home test. UPT in office today is positive.  Pt states her LMP is 08/27/2015.

## 2015-11-14 ENCOUNTER — Encounter: Payer: 59 | Admitting: Obstetrics

## 2015-11-23 ENCOUNTER — Ambulatory Visit (INDEPENDENT_AMBULATORY_CARE_PROVIDER_SITE_OTHER): Payer: 59 | Admitting: Obstetrics

## 2015-11-23 ENCOUNTER — Encounter: Payer: Self-pay | Admitting: Obstetrics

## 2015-11-23 VITALS — BP 112/76 | HR 84 | Wt 159.0 lb

## 2015-11-23 DIAGNOSIS — Z3482 Encounter for supervision of other normal pregnancy, second trimester: Secondary | ICD-10-CM | POA: Diagnosis not present

## 2015-11-23 LAB — POCT URINALYSIS DIPSTICK
Bilirubin, UA: NEGATIVE
Blood, UA: NEGATIVE
Glucose, UA: NEGATIVE
Ketones, UA: NEGATIVE
Leukocytes, UA: NEGATIVE
Nitrite, UA: NEGATIVE
Spec Grav, UA: 1.015
Urobilinogen, UA: NEGATIVE
pH, UA: 6

## 2015-11-23 MED ORDER — VITAFOL-OB PO TABS: 1.0000 | ORAL_TABLET | Freq: Every day | ORAL | Status: DC

## 2015-11-25 ENCOUNTER — Encounter: Payer: Self-pay | Admitting: Obstetrics

## 2015-11-25 LAB — PRENATAL PROFILE I(LABCORP)
Antibody Screen: NEGATIVE
BASOS ABS: 0 10*3/uL (ref 0.0–0.2)
Basos: 0 %
EOS (ABSOLUTE): 0.1 10*3/uL (ref 0.0–0.4)
Eos: 2 %
HEMOGLOBIN: 11.6 g/dL (ref 11.1–15.9)
Hematocrit: 34.6 % (ref 34.0–46.6)
Hepatitis B Surface Ag: NEGATIVE
IMMATURE GRANS (ABS): 0 10*3/uL (ref 0.0–0.1)
Immature Granulocytes: 0 %
LYMPHS: 20 %
Lymphocytes Absolute: 1 10*3/uL (ref 0.7–3.1)
MCH: 27.8 pg (ref 26.6–33.0)
MCHC: 33.5 g/dL (ref 31.5–35.7)
MCV: 83 fL (ref 79–97)
MONOCYTES: 7 %
MONOS ABS: 0.3 10*3/uL (ref 0.1–0.9)
NEUTROS ABS: 3.5 10*3/uL (ref 1.4–7.0)
Neutrophils: 71 %
PLATELETS: 244 10*3/uL (ref 150–379)
RBC: 4.18 x10E6/uL (ref 3.77–5.28)
RDW: 14.4 % (ref 12.3–15.4)
RPR Ser Ql: NONREACTIVE
Rh Factor: POSITIVE
Rubella Antibodies, IGG: 7.34 index (ref >0.99)
WBC: 4.9 10*3/uL (ref 3.4–10.8)

## 2015-11-25 LAB — VITAMIN D 25 HYDROXY (VIT D DEFICIENCY, FRACTURES): VIT D 25 HYDROXY: 24.7 ng/mL — AB (ref 30.0–100.0)

## 2015-11-25 LAB — VARICELLA ZOSTER ANTIBODY, IGG: VARICELLA: 2510 {index} (ref >165)

## 2015-11-25 LAB — PAP IG AND HPV HIGH-RISK
HPV, high-risk: NEGATIVE
PAP Smear Comment: 0

## 2015-11-25 LAB — HEMOGLOBINOPATHY EVALUATION
HEMOGLOBIN A2 QUANTITATION: 2.9 % (ref 0.7–3.1)
HGB C: 0 %
HGB S: 0 %
Hemoglobin F Quantitation: 0 % (ref 0.0–2.0)
Hgb A: 97.1 % (ref 94.0–98.0)

## 2015-11-25 LAB — NUSWAB VAGINITIS PLUS (VG+)
CANDIDA GLABRATA, NAA: NEGATIVE
Candida albicans, NAA: NEGATIVE
Chlamydia trachomatis, NAA: NEGATIVE
Neisseria gonorrhoeae, NAA: NEGATIVE
TRICH VAG BY NAA: NEGATIVE

## 2015-11-25 LAB — HIV ANTIBODY (ROUTINE TESTING W REFLEX): HIV Screen 4th Generation wRfx: NONREACTIVE

## 2015-11-25 LAB — CULTURE, OB URINE

## 2015-11-25 LAB — URINE CULTURE, OB REFLEX

## 2015-11-25 NOTE — Progress Notes (Signed)
Subjective:    Annette Lambert is being seen today for her first obstetrical visit.  This is not a planned pregnancy. She is at [redacted]w[redacted]d gestation. Her obstetrical history is significant for advanced maternal age. Relationship with FOB: spouse, living together. Patient does intend to breast feed. Pregnancy history fully reviewed.  The information documented in the HPI was reviewed and verified.  Menstrual History: OB History    Gravida Para Term Preterm AB TAB SAB Ectopic Multiple Living   0 1 0 1 0 0 5       Patient's last menstrual period was 08/27/2015.    Past Medical History  Diagnosis Date  . No pertinent past medical history     Past Surgical History  Procedure Laterality Date  . No past surgeries       (Not in a hospital admission) No Known Allergies  Social History  Substance Use Topics  . Smoking status: Never Smoker   . Smokeless tobacco: Never Used  . Alcohol Use: No    History reviewed. No pertinent family history.   Review of Systems Constitutional: negative for weight loss Gastrointestinal: negative for vomiting Genitourinary:negative for genital lesions and vaginal discharge and dysuria Musculoskeletal:negative for back pain Behavioral/Psych: negative for abusive relationship, depression, illegal drug usage and tobacco use    Objective:    BP 112/76 mmHg  Pulse 84  Wt 159 lb (72.122 kg)  LMP 08/27/2015 General Appearance:    Alert, cooperative, no distress, appears stated age  Head:    Normocephalic, without obvious abnormality, atraumatic  Eyes:    PERRL, conjunctiva/corneas clear, EOM's intact, fundi    benign, both eyes  Ears:    Normal TM's and external ear canals, both ears  Nose:   Nares normal, septum midline, mucosa normal, no drainage    or sinus tenderness  Throat:   Lips, mucosa, and tongue normal; teeth and gums normal  Neck:   Supple, symmetrical, trachea midline, no adenopathy;    thyroid:  no enlargement/tenderness/nodules; no  carotid   bruit or JVD  Back:     Symmetric, no curvature, ROM normal, no CVA tenderness  Lungs:     Clear to auscultation bilaterally, respirations unlabored  Chest Wall:    No tenderness or deformity   Heart:    Regular rate and rhythm, S1 and S2 normal, no murmur, rub   or gallop  Breast Exam:    No tenderness, masses, or nipple abnormality  Abdomen:     Soft, non-tender, bowel sounds active all four quadrants,    no masses, no organomegaly  Genitalia:    Normal female without lesion, discharge or tenderness  Extremities:   Extremities normal, atraumatic, no cyanosis or edema  Pulses:   2+ and symmetric all extremities  Skin:   Skin color, texture, turgor normal, no rashes or lesions  Lymph nodes:   Cervical, supraclavicular, and axillary nodes normal  Neurologic:   CNII-XII intact, normal strength, sensation and reflexes    throughout      Lab Review Urine pregnancy test Labs reviewed yes Radiologic studies reviewed yes  Assessment:    Pregnancy at [redacted]w[redacted]d weeks    AMA   Plan:     Referred to MFM for AMA Prenatal vitamins.  Counseling provided regarding continued use of seat belts, cessation of alcohol consumption, smoking or use of illicit drugs; infection precautions i.e., influenza/TDAP immunizations, toxoplasmosis,CMV, parvovirus, listeria and varicella; workplace safety, exercise during pregnancy; routine dental care, safe medications, sexual activity,  hot tubs, saunas, pools, travel, caffeine use, fish and methlymercury, potential toxins, hair treatments, varicose veins Weight gain recommendations per IOM guidelines reviewed: underweight/BMI< 18.5--> gain 28 - 40 lbs; normal weight/BMI 18.5 - 24.9--> gain 25 - 35 lbs; overweight/BMI 25 - 29.9--> gain 15 - 25 lbs; obese/BMI >30->gain  11 - 20 lbs Problem list reviewed and updated. FIRST/CF mutation testing/NIPT/QUAD SCREEN/fragile X/Ashkenazi Jewish population testing/Spinal muscular atrophy discussed: requested. Role of  ultrasound in pregnancy discussed; fetal survey: requested. Amniocentesis discussed: not indicated. VBAC calculator score: VBAC consent form provided Meds ordered this encounter  Medications  . Prenatal Vit-Fe Fumarate-FA (PRENATAL MULTIVITAMIN) TABS tablet    Sig: Take 1 tablet by mouth daily at 12 noon.  Marland Kitchen. doxylamine, Sleep, (UNISOM) 25 MG tablet    Sig: Take 25 mg by mouth at bedtime as needed.  . Prenatal Vit-Fe Fumarate-FA (VITAFOL-OB) TABS    Sig: Take 1 tablet by mouth daily before breakfast.    Dispense:  90 each    Refill:  3   Orders Placed This Encounter  Procedures  . Culture, OB Urine  . Result  . HIV antibody  . Hemoglobinopathy evaluation  . Varicella zoster antibody, IgG  . VITAMIN D 25 Hydroxy (Vit-D Deficiency, Fractures)  . Prenatal Profile I  . NuSwab Vaginitis Plus (VG+)  . POCT urinalysis dipstick    Follow up in 4 weeks.

## 2015-11-30 ENCOUNTER — Encounter: Payer: 59 | Admitting: Obstetrics

## 2015-12-22 ENCOUNTER — Other Ambulatory Visit: Payer: Self-pay | Admitting: *Deleted

## 2015-12-22 ENCOUNTER — Telehealth: Payer: Self-pay

## 2015-12-22 ENCOUNTER — Ambulatory Visit (INDEPENDENT_AMBULATORY_CARE_PROVIDER_SITE_OTHER): Payer: 59 | Admitting: Obstetrics

## 2015-12-22 VITALS — BP 123/77 | HR 98 | Wt 158.0 lb

## 2015-12-22 DIAGNOSIS — O09522 Supervision of elderly multigravida, second trimester: Secondary | ICD-10-CM

## 2015-12-22 DIAGNOSIS — Z3482 Encounter for supervision of other normal pregnancy, second trimester: Secondary | ICD-10-CM

## 2015-12-22 DIAGNOSIS — Z3492 Encounter for supervision of normal pregnancy, unspecified, second trimester: Secondary | ICD-10-CM

## 2015-12-22 LAB — POCT URINALYSIS DIPSTICK
BILIRUBIN UA: NEGATIVE
Blood, UA: NEGATIVE
GLUCOSE UA: NEGATIVE
Ketones, UA: NEGATIVE
LEUKOCYTES UA: NEGATIVE
NITRITE UA: NEGATIVE
PH UA: 7
Protein, UA: NEGATIVE
Spec Grav, UA: <=1.005
Urobilinogen, UA: NEGATIVE

## 2015-12-22 NOTE — Telephone Encounter (Signed)
NEED MFM IN U/S FOR ACROSS ST - US MFM OB COMP + 14WK - THANKS!

## 2015-12-26 ENCOUNTER — Encounter: Payer: Self-pay | Admitting: Obstetrics

## 2015-12-26 NOTE — Progress Notes (Signed)
  Subjective:    Annette Lambert is a 35 y.o. female being seen today for her obstetrical visit. She is at 2065w2d gestation. Patient reports: no complaints.  Problem List Items Addressed This Visit    None    Visit Diagnoses    Encounter for supervision of other normal pregnancy in second trimester    -  Primary    Relevant Orders    POCT urinalysis dipstick (Completed)    US OB Comp + 14 Wk    US OB Transvaginal    AMB referral to maternal fetal medicine    AMB MFM GENETICS REFERRAL    US MFM OB DETAIL +14 WK    AMA (advanced maternal age) multigravida 35+, second trimester        Relevant Orders    US OB Comp + 14 Wk    US OB Transvaginal    AMB referral to maternal fetal medicine    AMB MFM GENETICS REFERRAL    US MFM OB DETAIL +14 WK      Patient Active Problem List   Diagnosis Date Noted  . Normal delivery 02/23/2012    Objective:     BP 123/77 mmHg  Pulse 98  Wt 158 lb (71.668 kg)  LMP 08/27/2015 Uterine Size: Below umbilicus     Assessment:    Pregnancy @ 5365w2d  weeks Doing well    Plan:    Problem list reviewed and updated. Labs reviewed.  Follow up in 4 weeks. FIRST/CF mutation testing/NIPT/QUAD SCREEN/fragile X/Ashkenazi Jewish population testing/Spinal muscular atrophy discussed: requested. Role of ultrasound in pregnancy discussed; fetal survey: requested. Amniocentesis discussed: not indicated.

## 2015-12-28 ENCOUNTER — Encounter (HOSPITAL_COMMUNITY): Payer: Self-pay | Admitting: Obstetrics

## 2016-01-03 ENCOUNTER — Ambulatory Visit (HOSPITAL_COMMUNITY): Payer: Self-pay

## 2016-01-03 ENCOUNTER — Encounter (HOSPITAL_COMMUNITY): Payer: Self-pay

## 2016-01-04 ENCOUNTER — Ambulatory Visit (HOSPITAL_COMMUNITY): Admission: RE | Admit: 2016-01-04 | Payer: Medicaid Other | Source: Ambulatory Visit

## 2016-01-04 ENCOUNTER — Ambulatory Visit (HOSPITAL_COMMUNITY)
Admission: RE | Admit: 2016-01-04 | Discharge: 2016-01-04 | Disposition: A | Discharge status: Home / Self Care | Payer: Medicaid Other | Source: Ambulatory Visit | Attending: Obstetrics | Admitting: Obstetrics

## 2016-01-04 ENCOUNTER — Encounter (HOSPITAL_COMMUNITY): Payer: Self-pay

## 2016-01-04 ENCOUNTER — Other Ambulatory Visit: Payer: Self-pay | Admitting: Obstetrics

## 2016-01-04 DIAGNOSIS — Z3A18 18 weeks gestation of pregnancy: Secondary | ICD-10-CM | POA: Diagnosis not present

## 2016-01-04 DIAGNOSIS — Z36 Encounter for antenatal screening of mother: Secondary | ICD-10-CM | POA: Diagnosis not present

## 2016-01-04 DIAGNOSIS — O09522 Supervision of elderly multigravida, second trimester: Secondary | ICD-10-CM | POA: Diagnosis present

## 2016-01-04 DIAGNOSIS — Z3482 Encounter for supervision of other normal pregnancy, second trimester: Secondary | ICD-10-CM

## 2016-01-19 ENCOUNTER — Encounter: Payer: 59 | Admitting: Obstetrics

## 2016-01-19 ENCOUNTER — Ambulatory Visit (INDEPENDENT_AMBULATORY_CARE_PROVIDER_SITE_OTHER): Payer: 59 | Admitting: Obstetrics

## 2016-01-19 ENCOUNTER — Encounter: Payer: Self-pay | Admitting: Obstetrics

## 2016-01-19 VITALS — BP 106/66 | HR 91 | Temp 98.7°F | Wt 164.0 lb

## 2016-01-19 DIAGNOSIS — O09522 Supervision of elderly multigravida, second trimester: Secondary | ICD-10-CM

## 2016-01-19 DIAGNOSIS — Z3482 Encounter for supervision of other normal pregnancy, second trimester: Secondary | ICD-10-CM

## 2016-01-19 NOTE — Progress Notes (Signed)
Subjective:    Annette MooreSahar Lambert is a 35 y.o. female being seen today for her obstetrical visit. She is at 1818w5d gestation. Patient reports: no complaints . Fetal movement: normal.  Problem List Items Addressed This Visit    None     Patient Active Problem List   Diagnosis Date Noted  . Normal delivery 02/23/2012   Objective:    BP 106/66 mmHg  Pulse 91  Temp(Src) 98.7 F (37.1 C)  Wt 164 lb (74.39 kg)  LMP 08/27/2015 FHT: 150 BPM  Uterine Size: size equals dates     Assessment:    Pregnancy @ 7718w5d    Plan:    Signs and symptoms of preterm labor: discussed.  Labs, problem list reviewed and updated 2 hr GTT planned Follow up in 4 weeks.

## 2016-02-15 ENCOUNTER — Encounter (HOSPITAL_COMMUNITY): Payer: Self-pay

## 2016-02-15 ENCOUNTER — Ambulatory Visit (INDEPENDENT_AMBULATORY_CARE_PROVIDER_SITE_OTHER): Payer: 59 | Admitting: Certified Nurse Midwife

## 2016-02-15 ENCOUNTER — Other Ambulatory Visit (HOSPITAL_COMMUNITY): Payer: Self-pay | Admitting: *Deleted

## 2016-02-15 ENCOUNTER — Ambulatory Visit (HOSPITAL_COMMUNITY)
Admission: RE | Admit: 2016-02-15 | Discharge: 2016-02-15 | Disposition: A | Discharge status: Home / Self Care | Payer: Medicaid Other | Source: Ambulatory Visit | Attending: Obstetrics | Admitting: Obstetrics

## 2016-02-15 VITALS — BP 114/73 | HR 83 | Temp 98.0°F | Wt 172.0 lb

## 2016-02-15 DIAGNOSIS — O409XX0 Polyhydramnios, unspecified trimester, not applicable or unspecified: Secondary | ICD-10-CM

## 2016-02-15 DIAGNOSIS — Z36 Encounter for antenatal screening of mother: Secondary | ICD-10-CM | POA: Diagnosis not present

## 2016-02-15 DIAGNOSIS — O09529 Supervision of elderly multigravida, unspecified trimester: Secondary | ICD-10-CM

## 2016-02-15 DIAGNOSIS — O09522 Supervision of elderly multigravida, second trimester: Secondary | ICD-10-CM | POA: Diagnosis present

## 2016-02-15 DIAGNOSIS — Z3A24 24 weeks gestation of pregnancy: Secondary | ICD-10-CM | POA: Diagnosis not present

## 2016-02-15 DIAGNOSIS — Z3492 Encounter for supervision of normal pregnancy, unspecified, second trimester: Secondary | ICD-10-CM

## 2016-02-15 DIAGNOSIS — O402XX1 Polyhydramnios, second trimester, fetus 1: Secondary | ICD-10-CM

## 2016-02-15 LAB — POCT URINALYSIS DIPSTICK
Bilirubin, UA: NEGATIVE
Glucose, UA: NEGATIVE
Ketones, UA: NEGATIVE
NITRITE UA: NEGATIVE
PH UA: 7
RBC UA: NEGATIVE
Spec Grav, UA: 1.01
UROBILINOGEN UA: NEGATIVE

## 2016-02-15 NOTE — Progress Notes (Signed)
Subjective:    Tad MooreSahar Flax is a 35 y.o. female being seen today for her obstetrical visit. She is at 3365w4d gestation. Patient reports: no complaints . Fetal movement: normal.  Problem List Items Addressed This Visit    None    Visit Diagnoses    Prenatal care, second trimester    -  Primary    Relevant Orders    POCT urinalysis dipstick (Completed)    Encounter for supervision of multigravida with advanced maternal age        Relevant Orders    MaterniT21 PLUS Core+SCA    Hemoglobin A1c    Polyhydramnios, second trimester, fetus 1        Relevant Orders    Hemoglobin A1c      Patient Active Problem List   Diagnosis Date Noted  . Normal delivery 02/23/2012   Objective:    BP 114/73 mmHg  Pulse 83  Temp(Src) 98 F (36.7 C)  Wt 172 lb (78.019 kg)  LMP 08/27/2015 FHT: 150 BPM  Uterine Size: 30 cm and size greater than dates     Assessment:    Pregnancy @ 6765w4d    Polyhydramnios today at MFM  S>D Plan:    OBGCT: discussed and ordered for next visit. Signs and symptoms of preterm labor: discussed.  Labs, problem list reviewed and updated 2 hr GTT planned Follow up in 4 weeks with OGTT.

## 2016-02-15 NOTE — Progress Notes (Signed)
Patient is feeling pain at her umbilical x 2  weeks( more at night).

## 2016-02-16 LAB — HEMOGLOBIN A1C
ESTIMATED AVERAGE GLUCOSE: 97 mg/dL
HEMOGLOBIN A1C: 5 % (ref 4.8–5.6)

## 2016-02-23 ENCOUNTER — Other Ambulatory Visit: Payer: Self-pay | Admitting: Certified Nurse Midwife

## 2016-02-23 LAB — MATERNIT21 PLUS CORE+SCA
CHROMOSOME 13: NEGATIVE
CHROMOSOME 18: NEGATIVE
CHROMOSOME 21: NEGATIVE
PDF: 0
Y CHROMOSOME: NOT DETECTED

## 2016-03-14 ENCOUNTER — Ambulatory Visit (HOSPITAL_COMMUNITY)
Admission: RE | Admit: 2016-03-14 | Discharge: 2016-03-14 | Disposition: A | Discharge status: Home / Self Care | Payer: Medicaid Other | Source: Ambulatory Visit | Attending: Obstetrics | Admitting: Obstetrics

## 2016-03-14 ENCOUNTER — Encounter (HOSPITAL_COMMUNITY): Payer: Self-pay

## 2016-03-14 ENCOUNTER — Other Ambulatory Visit (HOSPITAL_COMMUNITY): Payer: Self-pay | Admitting: Obstetrics and Gynecology

## 2016-03-14 DIAGNOSIS — O409XX0 Polyhydramnios, unspecified trimester, not applicable or unspecified: Secondary | ICD-10-CM

## 2016-03-14 DIAGNOSIS — Z3A00 Weeks of gestation of pregnancy not specified: Secondary | ICD-10-CM | POA: Diagnosis not present

## 2016-03-14 NOTE — ED Notes (Signed)
Pt reports having right sided rib pain.

## 2016-03-15 ENCOUNTER — Ambulatory Visit (INDEPENDENT_AMBULATORY_CARE_PROVIDER_SITE_OTHER): Payer: 59 | Admitting: Certified Nurse Midwife

## 2016-03-15 ENCOUNTER — Other Ambulatory Visit: Payer: 59

## 2016-03-15 VITALS — BP 100/66 | HR 102 | Wt 180.0 lb

## 2016-03-15 DIAGNOSIS — Z3483 Encounter for supervision of other normal pregnancy, third trimester: Secondary | ICD-10-CM

## 2016-03-15 DIAGNOSIS — K219 Gastro-esophageal reflux disease without esophagitis: Secondary | ICD-10-CM

## 2016-03-15 DIAGNOSIS — O09529 Supervision of elderly multigravida, unspecified trimester: Secondary | ICD-10-CM

## 2016-03-15 DIAGNOSIS — O99613 Diseases of the digestive system complicating pregnancy, third trimester: Secondary | ICD-10-CM

## 2016-03-15 LAB — POCT URINALYSIS DIPSTICK
Bilirubin, UA: NEGATIVE
Blood, UA: NEGATIVE
Glucose, UA: NEGATIVE
Ketones, UA: NEGATIVE
Leukocytes, UA: NEGATIVE
Nitrite, UA: NEGATIVE
Protein, UA: NEGATIVE
Spec Grav, UA: 1.01
Urobilinogen, UA: NEGATIVE
pH, UA: 7

## 2016-03-15 MED ORDER — OMEPRAZOLE 20 MG PO CPDR: 20.0000 mg | DELAYED_RELEASE_CAPSULE | Freq: Two times a day (BID) | ORAL | 5 refills | Status: DC

## 2016-03-15 NOTE — Progress Notes (Signed)
Subjective:    Annette MooreSahar Lambert is a 35 y.o. female being seen today for her obstetrical visit. She is at 2938w5d gestation. Patient reports backache, heartburn, no bleeding, no cramping, no leaking and occasional contractions. Fetal movement: normal.  Problem List Items Addressed This Visit      Other   Supervision of high-risk pregnancy of elderly multigravida    Other Visit Diagnoses    Encounter for supervision of other normal pregnancy in third trimester    -  Primary   Relevant Orders   POCT urinalysis dipstick (Completed)   Gastroesophageal reflux in pregancy, third trimester       Relevant Medications   omeprazole (PRILOSEC) 20 MG capsule     Patient Active Problem List   Diagnosis Date Noted  . Supervision of high-risk pregnancy of elderly multigravida 03/15/2016  . Normal delivery 02/23/2012   Objective:    BP 100/66   Pulse (!) 102   Wt 180 lb (81.6 kg)   LMP 08/27/2015   BMI 28.19 kg/m  FHT:  140 BPM  Uterine Size: 36 cm and size greater than dates  Presentation: cephalic     Assessment:    Pregnancy @ 8838w5d weeks   AMA  Polyhydramnios  Reflux in pregnancy  Plan:    NSTs starting at 32 weeks   Discussed precautions of polyhydramnios   labs reviewed, problem list updated Consent signed. GBS planning TDAP offered  Rhogam given for RH negative Pediatrician: discussed. Infant feeding: plans to breastfeed. Maternity leave: N/A. Cigarette smoking: never smoked. Orders Placed This Encounter  Procedures  . POCT urinalysis dipstick   Meds ordered this encounter  Medications  . omeprazole (PRILOSEC) 20 MG capsule    Sig: Take 1 capsule (20 mg total) by mouth 2 (two) times daily before a meal.    Dispense:  60 capsule    Refill:  5   Follow up in 2 Weeks.

## 2016-03-15 NOTE — Addendum Note (Signed)
Addended by: Marya LandryFOSTER, Chavez Rosol D on: 03/15/2016 10:58 AM   Modules accepted: Orders

## 2016-03-16 ENCOUNTER — Other Ambulatory Visit: Payer: Self-pay | Admitting: Certified Nurse Midwife

## 2016-03-16 DIAGNOSIS — O09529 Supervision of elderly multigravida, unspecified trimester: Secondary | ICD-10-CM

## 2016-03-16 LAB — GLUCOSE TOLERANCE, 2 HOURS W/ 1HR
GLUCOSE, 1 HOUR: 159 mg/dL (ref 65–179)
GLUCOSE, FASTING: 81 mg/dL (ref 65–91)
Glucose, 2 hour: 120 mg/dL (ref 65–152)

## 2016-03-16 LAB — CBC
HEMATOCRIT: 34.3 % (ref 34.0–46.6)
Hemoglobin: 11.1 g/dL (ref 11.1–15.9)
MCH: 28 pg (ref 26.6–33.0)
MCHC: 32.4 g/dL (ref 31.5–35.7)
MCV: 87 fL (ref 79–97)
Platelets: 228 10*3/uL (ref 150–379)
RBC: 3.96 x10E6/uL (ref 3.77–5.28)
RDW: 14.8 % (ref 12.3–15.4)
WBC: 7.3 10*3/uL (ref 3.4–10.8)

## 2016-03-16 LAB — HIV ANTIBODY (ROUTINE TESTING W REFLEX): HIV SCREEN 4TH GENERATION: NONREACTIVE

## 2016-03-16 LAB — RPR: RPR Ser Ql: NONREACTIVE

## 2016-03-21 ENCOUNTER — Ambulatory Visit (HOSPITAL_COMMUNITY)
Admission: RE | Admit: 2016-03-21 | Discharge: 2016-03-21 | Disposition: A | Discharge status: Home / Self Care | Payer: 59 | Source: Ambulatory Visit | Attending: Obstetrics | Admitting: Obstetrics

## 2016-03-21 ENCOUNTER — Encounter (HOSPITAL_COMMUNITY): Payer: Self-pay

## 2016-03-21 ENCOUNTER — Other Ambulatory Visit (HOSPITAL_COMMUNITY): Payer: Self-pay | Admitting: Obstetrics and Gynecology

## 2016-03-21 DIAGNOSIS — O09523 Supervision of elderly multigravida, third trimester: Secondary | ICD-10-CM | POA: Insufficient documentation

## 2016-03-21 DIAGNOSIS — Z3A29 29 weeks gestation of pregnancy: Secondary | ICD-10-CM

## 2016-03-21 DIAGNOSIS — O403XX0 Polyhydramnios, third trimester, not applicable or unspecified: Secondary | ICD-10-CM

## 2016-03-21 DIAGNOSIS — O409XX0 Polyhydramnios, unspecified trimester, not applicable or unspecified: Secondary | ICD-10-CM

## 2016-03-21 MED ORDER — BETAMETHASONE SOD PHOS & ACET 6 (3-3) MG/ML IJ SUSP
12.0000 mg | Freq: Once | INTRAMUSCULAR | Status: AC
  Administered 2016-03-21: 12 mg via INTRAMUSCULAR
  Filled 2016-03-21: qty 2

## 2016-03-22 ENCOUNTER — Ambulatory Visit (HOSPITAL_COMMUNITY)
Admission: RE | Admit: 2016-03-22 | Discharge: 2016-03-22 | Disposition: A | Discharge status: Home / Self Care | Payer: 59 | Source: Ambulatory Visit | Attending: Obstetrics | Admitting: Obstetrics

## 2016-03-22 DIAGNOSIS — O09523 Supervision of elderly multigravida, third trimester: Secondary | ICD-10-CM | POA: Insufficient documentation

## 2016-03-22 DIAGNOSIS — O403XX0 Polyhydramnios, third trimester, not applicable or unspecified: Secondary | ICD-10-CM | POA: Diagnosis not present

## 2016-03-22 DIAGNOSIS — O365930 Maternal care for other known or suspected poor fetal growth, third trimester, not applicable or unspecified: Secondary | ICD-10-CM | POA: Diagnosis not present

## 2016-03-22 DIAGNOSIS — Z3A29 29 weeks gestation of pregnancy: Secondary | ICD-10-CM | POA: Diagnosis not present

## 2016-03-22 MED ORDER — BETAMETHASONE SOD PHOS & ACET 6 (3-3) MG/ML IJ SUSP
12.0000 mg | Freq: Once | INTRAMUSCULAR | Status: AC
  Administered 2016-03-22: 12 mg via INTRAMUSCULAR
  Filled 2016-03-22: qty 2

## 2016-03-27 ENCOUNTER — Encounter (HOSPITAL_COMMUNITY): Payer: Self-pay

## 2016-03-27 ENCOUNTER — Other Ambulatory Visit (HOSPITAL_COMMUNITY): Payer: Self-pay | Admitting: Maternal and Fetal Medicine

## 2016-03-27 ENCOUNTER — Ambulatory Visit (HOSPITAL_COMMUNITY)
Admission: RE | Admit: 2016-03-27 | Discharge: 2016-03-27 | Disposition: A | Discharge status: Home / Self Care | Payer: Medicaid Other | Source: Ambulatory Visit | Attending: Maternal and Fetal Medicine | Admitting: Maternal and Fetal Medicine

## 2016-03-27 ENCOUNTER — Ambulatory Visit (HOSPITAL_COMMUNITY)
Admission: RE | Admit: 2016-03-27 | Discharge: 2016-03-27 | Disposition: A | Discharge status: Home / Self Care | Payer: Medicaid Other | Source: Ambulatory Visit | Attending: Obstetrics | Admitting: Obstetrics

## 2016-03-27 ENCOUNTER — Other Ambulatory Visit (HOSPITAL_COMMUNITY): Payer: Medicaid Other

## 2016-03-27 DIAGNOSIS — O365930 Maternal care for other known or suspected poor fetal growth, third trimester, not applicable or unspecified: Secondary | ICD-10-CM | POA: Insufficient documentation

## 2016-03-27 DIAGNOSIS — O09523 Supervision of elderly multigravida, third trimester: Secondary | ICD-10-CM | POA: Insufficient documentation

## 2016-03-27 DIAGNOSIS — O403XX0 Polyhydramnios, third trimester, not applicable or unspecified: Secondary | ICD-10-CM | POA: Insufficient documentation

## 2016-03-27 DIAGNOSIS — Z3A30 30 weeks gestation of pregnancy: Secondary | ICD-10-CM | POA: Insufficient documentation

## 2016-03-27 DIAGNOSIS — O409XX0 Polyhydramnios, unspecified trimester, not applicable or unspecified: Secondary | ICD-10-CM

## 2016-03-27 NOTE — Progress Notes (Signed)
NST attempted, unable to continuously monitor fetal heart rate.  Dr. Sherrie Georgeecker notified.

## 2016-03-27 NOTE — ED Notes (Signed)
Pt monitored for 1.5hr for contractions.  Pt initially had contractions every 3-5 min, rating pain 3-4/10.  Contractions began to space out to 9 min, pt reports more "weak" than "strong" contractions.  Pt instructed to rest for the remainder of the day and night.  Pt and significant other voices understanding with interpreter at the bedside.

## 2016-03-27 NOTE — ED Notes (Signed)
Pt post amnio reduction, pt placed on toco only per Dr. Sherrie Georgeecker.

## 2016-03-28 ENCOUNTER — Ambulatory Visit (HOSPITAL_COMMUNITY): Admission: RE | Admit: 2016-03-28 | Payer: Medicaid Other | Source: Ambulatory Visit

## 2016-03-30 ENCOUNTER — Encounter: Payer: Self-pay | Admitting: Obstetrics

## 2016-03-30 ENCOUNTER — Ambulatory Visit (INDEPENDENT_AMBULATORY_CARE_PROVIDER_SITE_OTHER): Payer: 59 | Admitting: Obstetrics

## 2016-03-30 VITALS — BP 97/71 | HR 123 | Temp 98.4°F | Wt 172.1 lb

## 2016-03-30 DIAGNOSIS — Z3493 Encounter for supervision of normal pregnancy, unspecified, third trimester: Secondary | ICD-10-CM | POA: Diagnosis not present

## 2016-03-30 NOTE — Progress Notes (Signed)
Subjective:    Annette MooreSahar Lambert is a 35 y.o. female being seen today for her obstetrical visit. She is at 6937w6d gestation. Patient reports no complaints. Fetal movement: normal.  Problem List Items Addressed This Visit    None    Visit Diagnoses    Prenatal care, third trimester    -  Primary     Patient Active Problem List   Diagnosis Date Noted  . Supervision of high-risk pregnancy of elderly multigravida 03/15/2016  . Normal delivery 02/23/2012   Objective:    BP 97/71   Pulse (!) 123   Temp 98.4 F (36.9 C)   Wt 172 lb 1.6 oz (78.1 kg)   LMP 08/27/2015   BMI 26.95 kg/m  FHT:  150 BPM  Uterine Size: size equals dates  Presentation: unsure     Assessment:    Pregnancy @ 237w6d weeks   Plan:     labs reviewed, problem list updated Consent signed. GBS sent TDAP offered  Rhogam given for RH negative Pediatrician: discussed. Infant feeding: plans to breastfeed. Maternity leave: discussed. Cigarette smoking: never smoked. No orders of the defined types were placed in this encounter.  No orders of the defined types were placed in this encounter.  Follow up in 2 Weeks.

## 2016-04-06 ENCOUNTER — Ambulatory Visit (HOSPITAL_COMMUNITY)
Admission: RE | Admit: 2016-04-06 | Discharge: 2016-04-06 | Disposition: A | Discharge status: Home / Self Care | Payer: 59 | Source: Ambulatory Visit | Attending: Obstetrics | Admitting: Obstetrics

## 2016-04-06 ENCOUNTER — Encounter (HOSPITAL_COMMUNITY): Payer: Self-pay

## 2016-04-06 ENCOUNTER — Other Ambulatory Visit (HOSPITAL_COMMUNITY): Payer: Self-pay | Admitting: Obstetrics and Gynecology

## 2016-04-06 DIAGNOSIS — Z3A31 31 weeks gestation of pregnancy: Secondary | ICD-10-CM

## 2016-04-06 DIAGNOSIS — O09523 Supervision of elderly multigravida, third trimester: Secondary | ICD-10-CM | POA: Insufficient documentation

## 2016-04-06 DIAGNOSIS — O403XX0 Polyhydramnios, third trimester, not applicable or unspecified: Secondary | ICD-10-CM

## 2016-04-06 DIAGNOSIS — O365930 Maternal care for other known or suspected poor fetal growth, third trimester, not applicable or unspecified: Secondary | ICD-10-CM

## 2016-04-06 DIAGNOSIS — O409XX0 Polyhydramnios, unspecified trimester, not applicable or unspecified: Secondary | ICD-10-CM

## 2016-04-06 DIAGNOSIS — O42013 Preterm premature rupture of membranes, onset of labor within 24 hours of rupture, third trimester: Secondary | ICD-10-CM | POA: Diagnosis not present

## 2016-04-08 ENCOUNTER — Encounter (HOSPITAL_COMMUNITY): Payer: Self-pay

## 2016-04-08 ENCOUNTER — Inpatient Hospital Stay (HOSPITAL_COMMUNITY)
Admission: AD | Admit: 2016-04-08 | Discharge: 2016-04-12 | DRG: 765 | Disposition: A | Discharge status: Home / Self Care | Payer: 59 | Source: Ambulatory Visit | Attending: Obstetrics and Gynecology | Admitting: Obstetrics and Gynecology

## 2016-04-08 DIAGNOSIS — O42013 Preterm premature rupture of membranes, onset of labor within 24 hours of rupture, third trimester: Secondary | ICD-10-CM | POA: Diagnosis present

## 2016-04-08 DIAGNOSIS — O42919 Preterm premature rupture of membranes, unspecified as to length of time between rupture and onset of labor, unspecified trimester: Secondary | ICD-10-CM

## 2016-04-08 DIAGNOSIS — O4593 Premature separation of placenta, unspecified, third trimester: Secondary | ICD-10-CM | POA: Diagnosis present

## 2016-04-08 DIAGNOSIS — O365930 Maternal care for other known or suspected poor fetal growth, third trimester, not applicable or unspecified: Secondary | ICD-10-CM

## 2016-04-08 DIAGNOSIS — O288 Other abnormal findings on antenatal screening of mother: Secondary | ICD-10-CM

## 2016-04-08 DIAGNOSIS — D649 Anemia, unspecified: Secondary | ICD-10-CM | POA: Diagnosis present

## 2016-04-08 DIAGNOSIS — O403XX1 Polyhydramnios, third trimester, fetus 1: Secondary | ICD-10-CM

## 2016-04-08 DIAGNOSIS — O9902 Anemia complicating childbirth: Secondary | ICD-10-CM | POA: Diagnosis present

## 2016-04-08 DIAGNOSIS — O09523 Supervision of elderly multigravida, third trimester: Secondary | ICD-10-CM

## 2016-04-08 DIAGNOSIS — Z3A32 32 weeks gestation of pregnancy: Secondary | ICD-10-CM | POA: Diagnosis not present

## 2016-04-08 DIAGNOSIS — O4290 Premature rupture of membranes, unspecified as to length of time between rupture and onset of labor, unspecified weeks of gestation: Secondary | ICD-10-CM | POA: Diagnosis present

## 2016-04-08 DIAGNOSIS — O289 Unspecified abnormal findings on antenatal screening of mother: Secondary | ICD-10-CM

## 2016-04-08 DIAGNOSIS — O403XX0 Polyhydramnios, third trimester, not applicable or unspecified: Secondary | ICD-10-CM

## 2016-04-08 LAB — WET PREP, GENITAL
Sperm: NONE SEEN
TRICH WET PREP: NONE SEEN
YEAST WET PREP: NONE SEEN

## 2016-04-08 MED ORDER — AMOXICILLIN 500 MG PO CAPS: 500.0000 mg | ORAL_CAPSULE | Freq: Three times a day (TID) | ORAL | Status: DC

## 2016-04-08 MED ORDER — ACETAMINOPHEN 325 MG PO TABS: 650.0000 mg | ORAL_TABLET | ORAL | Status: DC | PRN

## 2016-04-08 MED ORDER — SODIUM CHLORIDE 0.9 % IV SOLN
2.0000 g | Freq: Four times a day (QID) | INTRAVENOUS | Status: DC
  Administered 2016-04-09 (×2): 2 g via INTRAVENOUS
  Filled 2016-04-08 (×3): qty 2000

## 2016-04-08 MED ORDER — BETAMETHASONE SOD PHOS & ACET 6 (3-3) MG/ML IJ SUSP: 12.0000 mg | Freq: Once | INTRAMUSCULAR | Status: DC

## 2016-04-08 MED ORDER — MAGNESIUM SULFATE BOLUS VIA INFUSION
4.0000 g | Freq: Once | INTRAVENOUS | Status: AC
  Administered 2016-04-08: 4 g via INTRAVENOUS
  Filled 2016-04-08: qty 500

## 2016-04-08 MED ORDER — CALCIUM CARBONATE ANTACID 500 MG PO CHEW: 2.0000 | CHEWABLE_TABLET | ORAL | Status: DC | PRN

## 2016-04-08 MED ORDER — PRENATAL MULTIVITAMIN CH
1.0000 | ORAL_TABLET | Freq: Every day | ORAL | Status: DC
  Filled 2016-04-08: qty 1

## 2016-04-08 MED ORDER — ZOLPIDEM TARTRATE 5 MG PO TABS: 5.0000 mg | ORAL_TABLET | Freq: Every evening | ORAL | Status: DC | PRN

## 2016-04-08 MED ORDER — DEXTROSE 5 % IV SOLN
500.0000 mg | INTRAVENOUS | Status: DC
  Administered 2016-04-09: 500 mg via INTRAVENOUS
  Filled 2016-04-08: qty 500

## 2016-04-08 MED ORDER — DOCUSATE SODIUM 100 MG PO CAPS
100.0000 mg | ORAL_CAPSULE | Freq: Every day | ORAL | Status: DC
  Filled 2016-04-08: qty 1

## 2016-04-08 MED ORDER — LACTATED RINGERS IV SOLN
INTRAVENOUS | Status: DC
  Administered 2016-04-08: via INTRAVENOUS

## 2016-04-08 MED ORDER — MAGNESIUM SULFATE 50 % IJ SOLN
2.0000 g/h | INTRAVENOUS | Status: DC
  Administered 2016-04-09: 2 g/h via INTRAVENOUS
  Filled 2016-04-08: qty 80

## 2016-04-08 MED ORDER — AZITHROMYCIN 500 MG PO TABS: 500.0000 mg | ORAL_TABLET | Freq: Every day | ORAL | Status: DC

## 2016-04-08 NOTE — MAU Note (Signed)
Pt had ROM today at 2100

## 2016-04-08 NOTE — H&P (Signed)
Annette Lambert is a 35 y.o. female [id number] @ 32.1 wks presenting for SROM @ 2100. Copious amt cl fluid on admit. OB History    Gravida Para Term Preterm AB Living   5 3 3  0 1 3   SAB TAB Ectopic Multiple Live Births   1 0 0 0 3     Past Medical History:  Diagnosis Date  . No pertinent past medical history    Past Surgical History:  Procedure Laterality Date  . NO PAST SURGERIES     Family History: family history is not on file. Social History:  reports that she has never smoked. She has never used smokeless tobacco. She reports that she does not drink alcohol or use drugs.     Maternal Diabetes: No Genetic Screening: Normal Maternal Ultrasounds/Referrals: Normal Fetal Ultrasounds or other Referrals:  None Maternal Substance Abuse:  No Significant Maternal Medications:  None Significant Maternal Lab Results:  None Other Comments:  severe poly  Review of Systems  Constitutional: Negative.   HENT: Negative.   Eyes: Negative.   Respiratory: Negative.   Cardiovascular: Negative.   Gastrointestinal: Positive for abdominal pain.       Mild abd cramping  Genitourinary: Negative.   Musculoskeletal: Negative.   Skin: Negative.   Neurological: Negative.   Endo/Heme/Allergies: Negative.   Psychiatric/Behavioral: Negative.    Maternal Medical History:  Reason for admission: Rupture of membranes.   Contractions: Onset was 1-2 hours ago.   Frequency: rare.   Perceived severity is mild.    Fetal activity: Perceived fetal activity is normal.   Last perceived fetal movement was within the past hour.    Prenatal complications: Polyhydramnios.   Prenatal Complications - Diabetes: none.    Dilation: Fingertip Effacement (%): Thick Station: -3 Exam by:: Zerita Boers CNM Blood pressure 125/70, pulse 95, temperature 98.7 F (37.1 C), temperature source Oral, resp. rate 16, last menstrual period 08/27/2015, currently breastfeeding. Maternal Exam:  Uterine Assessment:  Contraction strength is mild.  Contraction frequency is rare.   Abdomen: Patient reports no abdominal tenderness. Fetal presentation: vertex  Introitus: Normal vulva. Normal vagina.  Amniotic fluid character: clear.  Pelvis: adequate for delivery.   Cervix: Cervix evaluated by sterile speculum exam.     Fetal Exam Fetal Monitor Review: Mode: ultrasound.   Variability: moderate (6-25 bpm).   Pattern: accelerations present.    Fetal State Assessment: Category I - tracings are normal.     Physical Exam  Constitutional: She is oriented to person, place, and time. She appears well-developed and well-nourished.  HENT:  Head: Normocephalic.  Eyes: Pupils are equal, round, and reactive to light.  Neck: Normal range of motion.  Cardiovascular: Normal rate, regular rhythm, normal heart sounds and intact distal pulses.   Respiratory: Effort normal and breath sounds normal.  GI: Soft. Bowel sounds are normal.  Genitourinary: Vagina normal and uterus normal.  Musculoskeletal: Normal range of motion.  Neurological: She is alert and oriented to person, place, and time. She has normal reflexes.  Skin: Skin is warm and dry.  Psychiatric: She has a normal mood and affect. Her behavior is normal. Judgment and thought content normal.    Prenatal labs: ABO, Rh: O/Positive/-- (04/26 1120) Antibody: Negative (04/26 1120) Rubella: 7.34 (04/26 1120) RPR: Non Reactive (08/17 1102)  HBsAg: Negative (04/26 1120)  HIV: Non Reactive (08/17 1102)  GBS:     Assessment/Plan: Sterile spec exam, cultures done, GBS done, cervix visually ft/th/post/high. Vertex confirmed by u/s. BMX given 8/23 and  8/24.  Dr. Emelda FearFerguson consulted. Admit, Mgso4 as CP propha, iv antibiotics.   Annette Lambert 04/08/2016, 10:54 PM

## 2016-04-09 ENCOUNTER — Inpatient Hospital Stay (HOSPITAL_COMMUNITY): Payer: 59 | Admitting: Anesthesiology

## 2016-04-09 ENCOUNTER — Ambulatory Visit (HOSPITAL_COMMUNITY)
Admit: 2016-04-09 | Discharge: 2016-04-09 | Disposition: A | Discharge status: Home / Self Care | Payer: 59 | Attending: Obstetrics and Gynecology | Admitting: Obstetrics and Gynecology

## 2016-04-09 ENCOUNTER — Inpatient Hospital Stay (HOSPITAL_COMMUNITY): Payer: 59

## 2016-04-09 ENCOUNTER — Encounter (HOSPITAL_COMMUNITY): Payer: Self-pay | Admitting: *Deleted

## 2016-04-09 ENCOUNTER — Encounter (HOSPITAL_COMMUNITY)
Admission: AD | Disposition: A | Discharge status: Home / Self Care | Payer: Self-pay | Source: Ambulatory Visit | Attending: Obstetrics and Gynecology

## 2016-04-09 DIAGNOSIS — O9902 Anemia complicating childbirth: Secondary | ICD-10-CM

## 2016-04-09 DIAGNOSIS — O4593 Premature separation of placenta, unspecified, third trimester: Secondary | ICD-10-CM

## 2016-04-09 DIAGNOSIS — Z3A32 32 weeks gestation of pregnancy: Secondary | ICD-10-CM

## 2016-04-09 DIAGNOSIS — O42013 Preterm premature rupture of membranes, onset of labor within 24 hours of rupture, third trimester: Secondary | ICD-10-CM

## 2016-04-09 LAB — URINALYSIS, ROUTINE W REFLEX MICROSCOPIC
BILIRUBIN URINE: NEGATIVE
Glucose, UA: NEGATIVE mg/dL
Hgb urine dipstick: NEGATIVE
KETONES UR: NEGATIVE mg/dL
Leukocytes, UA: NEGATIVE
NITRITE: NEGATIVE
Protein, ur: NEGATIVE mg/dL
Specific Gravity, Urine: 1.015 (ref 1.005–1.030)
pH: 6 (ref 5.0–8.0)

## 2016-04-09 LAB — CBC
HCT: 26.9 % — ABNORMAL LOW (ref 36.0–46.0)
HEMATOCRIT: 30.2 % — AB (ref 36.0–46.0)
HEMATOCRIT: 27.4 % — AB (ref 36.0–46.0)
HEMOGLOBIN: 9.1 g/dL — AB (ref 12.0–15.0)
Hemoglobin: 10.2 g/dL — ABNORMAL LOW (ref 12.0–15.0)
Hemoglobin: 9 g/dL — ABNORMAL LOW (ref 12.0–15.0)
MCH: 28.3 pg (ref 26.0–34.0)
MCH: 28.3 pg (ref 26.0–34.0)
MCH: 27.9 pg (ref 26.0–34.0)
MCHC: 33.8 g/dL (ref 30.0–36.0)
MCHC: 33.5 g/dL (ref 30.0–36.0)
MCHC: 33.2 g/dL (ref 30.0–36.0)
MCV: 83.7 fL (ref 78.0–100.0)
MCV: 84.6 fL (ref 78.0–100.0)
MCV: 84 fL (ref 78.0–100.0)
PLATELETS: 235 10*3/uL (ref 150–400)
PLATELETS: 200 10*3/uL (ref 150–400)
Platelets: 175 10*3/uL (ref 150–400)
RBC: 3.61 MIL/uL — ABNORMAL LOW (ref 3.87–5.11)
RBC: 3.18 MIL/uL — ABNORMAL LOW (ref 3.87–5.11)
RBC: 3.26 MIL/uL — AB (ref 3.87–5.11)
RDW: 14.7 % (ref 11.5–15.5)
RDW: 14.7 % (ref 11.5–15.5)
RDW: 14.6 % (ref 11.5–15.5)
WBC: 11 10*3/uL — AB (ref 4.0–10.5)
WBC: 12.8 10*3/uL — AB (ref 4.0–10.5)
WBC: 9.2 10*3/uL (ref 4.0–10.5)

## 2016-04-09 LAB — GC/CHLAMYDIA PROBE AMP (~~LOC~~) NOT AT ARMC
Chlamydia: NEGATIVE
NEISSERIA GONORRHEA: NEGATIVE

## 2016-04-09 LAB — GROUP B STREP BY PCR: Group B strep by PCR: NEGATIVE

## 2016-04-09 SURGERY — Surgical Case
Anesthesia: Regional

## 2016-04-09 MED ORDER — MORPHINE SULFATE-NACL 0.5-0.9 MG/ML-% IV SOSY: PREFILLED_SYRINGE | INTRAVENOUS | Status: DC | PRN

## 2016-04-09 MED ORDER — DIPHENHYDRAMINE HCL 50 MG/ML IJ SOLN
12.5000 mg | INTRAMUSCULAR | Status: DC | PRN
Start: 2016-04-09 — End: 2016-04-12

## 2016-04-09 MED ORDER — PHENYLEPHRINE 8 MG IN D5W 100 ML (0.08MG/ML) PREMIX OPTIME
INJECTION | INTRAVENOUS | Status: AC
  Filled 2016-04-09: qty 100

## 2016-04-09 MED ORDER — OXYCODONE-ACETAMINOPHEN 5-325 MG PO TABS: 2.0000 | ORAL_TABLET | ORAL | Status: DC | PRN

## 2016-04-09 MED ORDER — SODIUM CHLORIDE 0.9% FLUSH: 3.0000 mL | INTRAVENOUS | Status: DC | PRN

## 2016-04-09 MED ORDER — OXYTOCIN 10 UNIT/ML IJ SOLN
INTRAMUSCULAR | Status: AC
  Filled 2016-04-09: qty 4

## 2016-04-09 MED ORDER — MENTHOL 3 MG MT LOZG: 1.0000 | LOZENGE | OROMUCOSAL | Status: DC | PRN

## 2016-04-09 MED ORDER — SODIUM CHLORIDE 0.9 % IR SOLN
Status: DC | PRN
  Administered 2016-04-09: 1000 mL

## 2016-04-09 MED ORDER — NALBUPHINE HCL 10 MG/ML IJ SOLN: 5.0000 mg | Freq: Once | INTRAMUSCULAR | Status: DC | PRN

## 2016-04-09 MED ORDER — SOD CITRATE-CITRIC ACID 500-334 MG/5ML PO SOLN
ORAL | Status: AC
  Administered 2016-04-09: 30 mL
  Filled 2016-04-09: qty 15

## 2016-04-09 MED ORDER — COCONUT OIL OIL: 1.0000 "application " | TOPICAL_OIL | Status: DC | PRN

## 2016-04-09 MED ORDER — LACTATED RINGERS IV SOLN
INTRAVENOUS | Status: DC | PRN
  Administered 2016-04-09 (×2): via INTRAVENOUS

## 2016-04-09 MED ORDER — SIMETHICONE 80 MG PO CHEW
80.0000 mg | CHEWABLE_TABLET | ORAL | Status: DC | PRN
Start: 2016-04-09 — End: 2016-04-12

## 2016-04-09 MED ORDER — CEFAZOLIN SODIUM-DEXTROSE 2-4 GM/100ML-% IV SOLN
INTRAVENOUS | Status: AC
  Filled 2016-04-09: qty 100

## 2016-04-09 MED ORDER — NALOXONE HCL 0.4 MG/ML IJ SOLN: 0.4000 mg | INTRAMUSCULAR | Status: DC | PRN

## 2016-04-09 MED ORDER — CEFAZOLIN SODIUM-DEXTROSE 2-3 GM-% IV SOLR
INTRAVENOUS | Status: DC | PRN
  Administered 2016-04-09: 2 g via INTRAVENOUS

## 2016-04-09 MED ORDER — OXYTOCIN 10 UNIT/ML IJ SOLN
INTRAVENOUS | Status: DC | PRN
  Administered 2016-04-09: 40 [IU] via INTRAVENOUS

## 2016-04-09 MED ORDER — ZOLPIDEM TARTRATE 5 MG PO TABS: 5.0000 mg | ORAL_TABLET | Freq: Every evening | ORAL | Status: DC | PRN

## 2016-04-09 MED ORDER — DIBUCAINE 1 % RE OINT: 1.0000 "application " | TOPICAL_OINTMENT | RECTAL | Status: DC | PRN

## 2016-04-09 MED ORDER — ACETAMINOPHEN 325 MG PO TABS: 650.0000 mg | ORAL_TABLET | ORAL | Status: DC | PRN

## 2016-04-09 MED ORDER — ONDANSETRON HCL 4 MG/2ML IJ SOLN
4.0000 mg | Freq: Three times a day (TID) | INTRAMUSCULAR | Status: DC | PRN
Start: 2016-04-09 — End: 2016-04-12
  Administered 2016-04-09: 4 mg via INTRAVENOUS
  Filled 2016-04-09: qty 2

## 2016-04-09 MED ORDER — LACTATED RINGERS IV SOLN
INTRAVENOUS | Status: DC
  Administered 2016-04-10: 01:00:00 via INTRAVENOUS

## 2016-04-09 MED ORDER — OXYCODONE-ACETAMINOPHEN 5-325 MG PO TABS
1.0000 | ORAL_TABLET | ORAL | Status: DC | PRN
  Administered 2016-04-10 – 2016-04-12 (×5): 1 via ORAL
  Filled 2016-04-09 (×5): qty 1

## 2016-04-09 MED ORDER — FENTANYL CITRATE (PF) 100 MCG/2ML IJ SOLN
INTRAMUSCULAR | Status: AC
  Filled 2016-04-09: qty 2

## 2016-04-09 MED ORDER — SIMETHICONE 80 MG PO CHEW
80.0000 mg | CHEWABLE_TABLET | Freq: Three times a day (TID) | ORAL | Status: DC
  Administered 2016-04-09 – 2016-04-12 (×10): 80 mg via ORAL
  Filled 2016-04-09 (×10): qty 1

## 2016-04-09 MED ORDER — BUPIVACAINE IN DEXTROSE 0.75-8.25 % IT SOLN
INTRATHECAL | Status: DC | PRN
  Administered 2016-04-09: 1.6 mL via INTRATHECAL

## 2016-04-09 MED ORDER — WITCH HAZEL-GLYCERIN EX PADS: 1.0000 "application " | MEDICATED_PAD | CUTANEOUS | Status: DC | PRN

## 2016-04-09 MED ORDER — ACETAMINOPHEN 500 MG PO TABS
1000.0000 mg | ORAL_TABLET | Freq: Four times a day (QID) | ORAL | Status: AC
  Administered 2016-04-09 – 2016-04-10 (×4): 1000 mg via ORAL
  Filled 2016-04-09 (×4): qty 2

## 2016-04-09 MED ORDER — PROMETHAZINE HCL 25 MG/ML IJ SOLN: 6.2500 mg | INTRAMUSCULAR | Status: DC | PRN

## 2016-04-09 MED ORDER — MORPHINE SULFATE (PF) 0.5 MG/ML IJ SOLN
INTRAMUSCULAR | Status: DC | PRN
  Administered 2016-04-09: .2 mg via EPIDURAL

## 2016-04-09 MED ORDER — ONDANSETRON HCL 4 MG/2ML IJ SOLN
INTRAMUSCULAR | Status: DC | PRN
  Administered 2016-04-09: 4 mg via INTRAVENOUS

## 2016-04-09 MED ORDER — TETANUS-DIPHTH-ACELL PERTUSSIS 5-2.5-18.5 LF-MCG/0.5 IM SUSP: 0.5000 mL | Freq: Once | INTRAMUSCULAR | Status: DC

## 2016-04-09 MED ORDER — KETOROLAC TROMETHAMINE 30 MG/ML IJ SOLN
30.0000 mg | Freq: Four times a day (QID) | INTRAMUSCULAR | Status: AC | PRN
  Administered 2016-04-09: 30 mg via INTRAVENOUS
  Filled 2016-04-09: qty 1

## 2016-04-09 MED ORDER — ONDANSETRON HCL 4 MG/2ML IJ SOLN
INTRAMUSCULAR | Status: AC
  Filled 2016-04-09: qty 2

## 2016-04-09 MED ORDER — FENTANYL CITRATE (PF) 100 MCG/2ML IJ SOLN
100.0000 ug | INTRAMUSCULAR | Status: DC | PRN
  Administered 2016-04-09: 100 ug via INTRAVENOUS
  Filled 2016-04-09: qty 2

## 2016-04-09 MED ORDER — MORPHINE SULFATE-NACL 0.5-0.9 MG/ML-% IV SOSY
PREFILLED_SYRINGE | INTRAVENOUS | Status: AC
  Filled 2016-04-09: qty 1

## 2016-04-09 MED ORDER — IBUPROFEN 600 MG PO TABS
600.0000 mg | ORAL_TABLET | Freq: Four times a day (QID) | ORAL | Status: DC
  Administered 2016-04-10 – 2016-04-12 (×11): 600 mg via ORAL
  Filled 2016-04-09 (×11): qty 1

## 2016-04-09 MED ORDER — DIPHENHYDRAMINE HCL 25 MG PO CAPS: 25.0000 mg | ORAL_CAPSULE | Freq: Four times a day (QID) | ORAL | Status: DC | PRN

## 2016-04-09 MED ORDER — SCOPOLAMINE 1 MG/3DAYS TD PT72
1.0000 | MEDICATED_PATCH | Freq: Once | TRANSDERMAL | Status: DC
  Filled 2016-04-09: qty 1

## 2016-04-09 MED ORDER — SOD CITRATE-CITRIC ACID 500-334 MG/5ML PO SOLN: 30.0000 mL | Freq: Once | ORAL | Status: DC

## 2016-04-09 MED ORDER — OXYTOCIN 40 UNITS IN LACTATED RINGERS INFUSION - SIMPLE MED: 2.5000 [IU]/h | INTRAVENOUS | Status: AC

## 2016-04-09 MED ORDER — PRENATAL MULTIVITAMIN CH
1.0000 | ORAL_TABLET | Freq: Every day | ORAL | Status: DC
  Administered 2016-04-10 – 2016-04-12 (×3): 1 via ORAL
  Filled 2016-04-09 (×3): qty 1

## 2016-04-09 MED ORDER — FENTANYL CITRATE (PF) 100 MCG/2ML IJ SOLN
INTRAMUSCULAR | Status: DC | PRN
  Administered 2016-04-09: 20 ug via INTRAVENOUS

## 2016-04-09 MED ORDER — SIMETHICONE 80 MG PO CHEW
80.0000 mg | CHEWABLE_TABLET | ORAL | Status: DC
  Administered 2016-04-10 – 2016-04-11 (×3): 80 mg via ORAL
  Filled 2016-04-09 (×3): qty 1

## 2016-04-09 MED ORDER — PHENYLEPHRINE 8 MG IN D5W 100 ML (0.08MG/ML) PREMIX OPTIME
INJECTION | INTRAVENOUS | Status: DC | PRN
  Administered 2016-04-09: 60 ug/min via INTRAVENOUS

## 2016-04-09 MED ORDER — PHENYLEPHRINE 40 MCG/ML (10ML) SYRINGE FOR IV PUSH (FOR BLOOD PRESSURE SUPPORT)
PREFILLED_SYRINGE | INTRAVENOUS | Status: DC | PRN
  Administered 2016-04-09: 80 ug via INTRAVENOUS

## 2016-04-09 MED ORDER — MEPERIDINE HCL 25 MG/ML IJ SOLN: 6.2500 mg | INTRAMUSCULAR | Status: DC | PRN

## 2016-04-09 MED ORDER — NALBUPHINE HCL 10 MG/ML IJ SOLN: 5.0000 mg | INTRAMUSCULAR | Status: DC | PRN

## 2016-04-09 MED ORDER — DIPHENHYDRAMINE HCL 25 MG PO CAPS: 25.0000 mg | ORAL_CAPSULE | ORAL | Status: DC | PRN

## 2016-04-09 MED ORDER — NALOXONE HCL 2 MG/2ML IJ SOSY
1.0000 ug/kg/h | PREFILLED_SYRINGE | INTRAVENOUS | Status: DC | PRN
  Filled 2016-04-09: qty 2

## 2016-04-09 MED ORDER — FENTANYL CITRATE (PF) 100 MCG/2ML IJ SOLN: 25.0000 ug | INTRAMUSCULAR | Status: DC | PRN

## 2016-04-09 MED ORDER — SENNOSIDES-DOCUSATE SODIUM 8.6-50 MG PO TABS
2.0000 | ORAL_TABLET | ORAL | Status: DC
Start: 2016-04-10 — End: 2016-04-12
  Administered 2016-04-10 – 2016-04-11 (×3): 2 via ORAL
  Filled 2016-04-09 (×3): qty 2

## 2016-04-09 MED ORDER — KETOROLAC TROMETHAMINE 30 MG/ML IJ SOLN: 30.0000 mg | Freq: Four times a day (QID) | INTRAMUSCULAR | Status: AC | PRN

## 2016-04-09 SURGICAL SUPPLY — 33 items
BENZOIN TINCTURE PRP APPL 2/3 (GAUZE/BANDAGES/DRESSINGS) ×3 IMPLANT
CHLORAPREP W/TINT 26ML (MISCELLANEOUS) ×3 IMPLANT
CLAMP CORD UMBIL (MISCELLANEOUS) IMPLANT
CLOSURE WOUND 1/2 X4 (GAUZE/BANDAGES/DRESSINGS) ×1
CLOTH BEACON ORANGE TIMEOUT ST (SAFETY) ×3 IMPLANT
DRSG OPSITE POSTOP 4X10 (GAUZE/BANDAGES/DRESSINGS) ×3 IMPLANT
ELECT REM PT RETURN 9FT ADLT (ELECTROSURGICAL) ×3
ELECTRODE REM PT RTRN 9FT ADLT (ELECTROSURGICAL) ×1 IMPLANT
EXTRACTOR VACUUM M CUP 4 TUBE (SUCTIONS) IMPLANT
EXTRACTOR VACUUM M CUP 4' TUBE (SUCTIONS)
GLOVE BIOGEL PI IND STRL 7.0 (GLOVE) ×2 IMPLANT
GLOVE BIOGEL PI IND STRL 7.5 (GLOVE) ×2 IMPLANT
GLOVE BIOGEL PI INDICATOR 7.0 (GLOVE) ×4
GLOVE BIOGEL PI INDICATOR 7.5 (GLOVE) ×4
GLOVE ECLIPSE 7.5 STRL STRAW (GLOVE) ×3 IMPLANT
GOWN STRL REUS W/TWL LRG LVL3 (GOWN DISPOSABLE) ×9 IMPLANT
KIT ABG SYR 3ML LUER SLIP (SYRINGE) IMPLANT
NEEDLE HYPO 25X5/8 SAFETYGLIDE (NEEDLE) IMPLANT
NS IRRIG 1000ML POUR BTL (IV SOLUTION) ×3 IMPLANT
PACK C SECTION WH (CUSTOM PROCEDURE TRAY) ×3 IMPLANT
PAD ABD 8X7 1/2 STERILE (GAUZE/BANDAGES/DRESSINGS) ×3 IMPLANT
PAD OB MATERNITY 4.3X12.25 (PERSONAL CARE ITEMS) ×3 IMPLANT
PENCIL SMOKE EVAC W/HOLSTER (ELECTROSURGICAL) ×3 IMPLANT
RTRCTR C-SECT PINK 25CM LRG (MISCELLANEOUS) ×3 IMPLANT
SPONGE GAUZE 4X4 12PLY (GAUZE/BANDAGES/DRESSINGS) ×6 IMPLANT
STRIP CLOSURE SKIN 1/2X4 (GAUZE/BANDAGES/DRESSINGS) ×2 IMPLANT
SUT VIC AB 0 CTX 36 (SUTURE) ×6
SUT VIC AB 0 CTX36XBRD ANBCTRL (SUTURE) ×3 IMPLANT
SUT VIC AB 2-0 CT1 27 (SUTURE) ×2
SUT VIC AB 2-0 CT1 TAPERPNT 27 (SUTURE) ×1 IMPLANT
SUT VIC AB 4-0 KS 27 (SUTURE) ×3 IMPLANT
TOWEL OR 17X24 6PK STRL BLUE (TOWEL DISPOSABLE) ×3 IMPLANT
TRAY FOLEY CATH SILVER 14FR (SET/KITS/TRAYS/PACK) ×3 IMPLANT

## 2016-04-09 NOTE — Anesthesia Postprocedure Evaluation (Deleted)
Anesthesia Post Note  Patient: Annette Lambert  Procedure(s) Performed: Procedure(s) (LRB): CESAREAN SECTION (N/A)  Patient location during evaluation: PACU Anesthesia Type: Spinal Level of consciousness: awake, awake and alert and oriented Pain management: pain level controlled Vital Signs Assessment: post-procedure vital signs reviewed and stable Respiratory status: spontaneous breathing, nonlabored ventilation and respiratory function stable Cardiovascular status: stable Postop Assessment: no headache, no backache, no signs of nausea or vomiting, adequate PO intake and patient able to bend at knees     Last Vitals:  Vitals:   04/09/16 1140 04/09/16 1240  BP: (!) 94/58 (!) 98/58  Pulse: 89 86  Resp: 14 16  Temp: 36.7 C 36.7 C    Last Pain:  Vitals:   04/09/16 1400  TempSrc:   PainSc: 3    Pain Goal: Patients Stated Pain Goal: 3 (pt states she can rest easily) (04/09/16 1400)               Isao Seltzer

## 2016-04-09 NOTE — Transfer of Care (Signed)
Immediate Anesthesia Transfer of Care Note  Patient: Annette Lambert  Procedure(s) Performed: Procedure(s): CESAREAN SECTION (N/A)  Patient Location: PACU  Anesthesia Type:Spinal  Level of Consciousness: awake  Airway & Oxygen Therapy: Patient Spontanous Breathing  Post-op Assessment: Report given to RN  Post vital signs: Reviewed and stable  Last Vitals:  Vitals:   04/09/16 0752 04/09/16 0801  BP:  (!) 90/45  Pulse:  86  Resp:  20  Temp: 37 C     Last Pain:  Vitals:   04/09/16 0752  TempSrc: Oral  PainSc:          Complications: No apparent anesthesia complications

## 2016-04-09 NOTE — Anesthesia Postprocedure Evaluation (Signed)
Anesthesia Post Note  Patient: Annette Lambert  Procedure(s) Performed: Procedure(s) (LRB): CESAREAN SECTION (N/A)  Patient location during evaluation: [facility] Anesthesia Type: Spinal Level of consciousness: awake, awake and alert and oriented Pain management: pain level controlled Vital Signs Assessment: post-procedure vital signs reviewed and stable Respiratory status: spontaneous breathing, nonlabored ventilation and respiratory function stable Cardiovascular status: stable Postop Assessment: no headache, no backache, adequate PO intake, no signs of nausea or vomiting and patient able to bend at knees     Last Vitals:  Vitals:   04/09/16 1140 04/09/16 1240  BP: (!) 94/58 (!) 98/58  Pulse: 89 86  Resp: 14 16  Temp: 36.7 C 36.7 C    Last Pain:  Vitals:   04/09/16 1400  TempSrc:   PainSc: 3    Pain Goal: Patients Stated Pain Goal: 3 (pt states she can rest easily) (04/09/16 1400)               Nasteho Glantz

## 2016-04-09 NOTE — Progress Notes (Signed)
Patient ID: Annette Lambert, female   DOB: 12/28/1980, 35 y.o.   MRN: [id number] FACULTY PRACTICE ANTEPARTUM(COMPREHENSIVE) NOTE  Annette Lambert is a 35 y.o. [id number] at [redacted]w[redacted]d who is admitted for PROM.  Now showing signs of PTL. Having DECELS WITH the infrequent contrations. These have been approximately q 20 mins from 4-6 am, now 40 mins without decel. Fetal presentation is cephalic. Length of Stay:  1  Days  Subjective: Pt denies pain or bleeding.  Patient reports the fetal movement as diminished Patient reports uterine contraction  activity as irregular, every 12-30 minutes. Patient reports  vaginal bleeding as none. Patient describes fluid per vagina as Clear.  Vitals:  Blood pressure 112/63, pulse 78, temperature 97.4 F (36.3 C), temperature source Oral, resp. rate 20, height 5\' 7"  (1.702 m), weight 175 lb (79.4 kg), last menstrual period 08/27/2015, currently breastfeeding. Physical Examination:  General appearance - alert, well appearing, and in no distress, normal appearing weight and anxious Heart - normal rate and regular rhythm Abdomen - soft, nontender, nondistended Fundal Height:  size equals dates Cervical Exam: Not evaluated. and was 1 cm visually on initial speculum exam and fetal presentation is cephalic. Extremities: extremities normal, atraumatic, no cyanosis or edema and Homans sign is negative, no sign of DVT with DTRs 2+ bilaterally Membranes:intact, ruptured, clear fluid  Fetal Monitoring:  Baseline: 150 bpm  Labs:  Results for orders placed or performed during the hospital encounter of 04/08/16 (from the past 24 hour(s))  Wet prep, genital   Collection Time: 04/08/16 10:50 PM  Result Value Ref Range   Yeast Wet Prep HPF POC NONE SEEN NONE SEEN   Trich, Wet Prep NONE SEEN NONE SEEN   Clue Cells Wet Prep HPF POC PRESENT (A) NONE SEEN   WBC, Wet Prep HPF POC FEW (A) NONE SEEN   Sperm NONE SEEN   Group B strep by PCR   Collection Time: 04/08/16 10:50 PM  Result  Value Ref Range   Group B strep by PCR NEGATIVE NEGATIVE  Type and screen [facility]   Collection Time: 04/08/16 11:15 PM  Result Value Ref Range   ABO/RH(D) O POS    Antibody Screen NEG    Sample Expiration 04/11/2016   Urinalysis, Routine w reflex microscopic (not at [facility])   Collection Time: 04/09/16  2:30 AM  Result Value Ref Range   Color, Urine YELLOW YELLOW   APPearance CLEAR CLEAR   Specific Gravity, Urine 1.015 1.005 - 1.030   pH 6.0 5.0 - 8.0   Glucose, UA NEGATIVE NEGATIVE mg/dL   Hgb urine dipstick NEGATIVE NEGATIVE   Bilirubin Urine NEGATIVE NEGATIVE   Ketones, ur NEGATIVE NEGATIVE mg/dL   Protein, ur NEGATIVE NEGATIVE mg/dL   Nitrite NEGATIVE NEGATIVE   Leukocytes, UA NEGATIVE NEGATIVE    Imaging Studies:     Currently EPIC will not allow sonographic studies to automatically populate into notes.  In the meantime, copy and paste results into note or free text.  Medications:  Scheduled . ampicillin (OMNIPEN) IV  2 g Intravenous Q6H   Followed by  . [START ON 04/10/2016] amoxicillin  500 mg Oral Q8H  . azithromycin  500 mg Intravenous Q24H   Followed by  . [START ON 04/10/2016] azithromycin  500 mg Oral Daily  . docusate sodium  100 mg Oral Daily  . prenatal multivitamin  1 tablet Oral Q1200     ASSESSMENT: Patient Active Problem List   Diagnosis Date Noted  . Premature rupture of  membranes 04/08/2016  . Supervision of high-risk pregnancy of elderly multigravida 03/15/2016  . Normal delivery 02/23/2012  hx polyhydramnios with Amnioreduction 8/29 x 4 liters with reaccumulation Normal anatomic survey. S/p Betamethasone x 2 doses , 2 wk ago PLAN: NPO, type and screen in place. MFM u/s, and consult this am Unlikely to tolerated continued contrations; family aware that persistence of decels will likely warrant delivery by cesarean.  [name] V 04/09/2016,6:10 AM

## 2016-04-09 NOTE — Progress Notes (Signed)
Lab results called to Dr. Adrian BlackwaterStinson

## 2016-04-09 NOTE — Anesthesia Postprocedure Evaluation (Signed)
Anesthesia Post Note  Patient: Annette Lambert  Procedure(s) Performed: Procedure(s) (LRB): CESAREAN SECTION (N/A)  Patient location during evaluation: PACU Anesthesia Type: Spinal Level of consciousness: oriented and awake and alert Pain management: pain level controlled Vital Signs Assessment: post-procedure vital signs reviewed and stable Respiratory status: spontaneous breathing, respiratory function stable and patient connected to nasal cannula oxygen Cardiovascular status: blood pressure returned to baseline and stable Postop Assessment: no headache and no backache Anesthetic complications: no     Last Vitals:  Vitals:   04/09/16 1100 04/09/16 1105  BP: 95/60 (!) 97/59  Pulse: 79 81  Resp: 12 15  Temp:      Last Pain:  Vitals:   04/09/16 1045  TempSrc:   PainSc: Asleep   Pain Goal:                 Bonita Quinichard S Katy Brickell

## 2016-04-09 NOTE — Addendum Note (Signed)
Addendum  created 04/09/16 1706 by Elbert Ewingsolleen S Sharlize Hoar, CRNA   Delete clinical note, Sign clinical note

## 2016-04-09 NOTE — Progress Notes (Signed)
Patient ID: Annette MooreSahar Lambert, female   DOB: 06/23/1981, 35 y.o.   MRN: [id number]  Patient had US this AM for repetitive lates with contractions.  US showed abruption.  Discussed with patient.  The risks of cesarean section discussed with the patient included but were not limited to: bleeding which may require transfusion or reoperation; infection which may require antibiotics; injury to bowel, bladder, ureters or other surrounding organs; injury to the fetus; need for additional procedures including hysterectomy in the event of a life-threatening hemorrhage; placental abnormalities wth subsequent pregnancies, incisional problems, thromboembolic phenomenon and other postoperative/anesthesia complications. The patient concurred with the proposed plan, giving informed written consent for the procedure.   Patient has been NPO since last night she will remain NPO for procedure. Anesthesia and OR aware.  Preoperative prophylactic Ancef ordered on call to the OR.  To OR when ready.  Levie HeritageJacob J Stinson, DO 04/09/2016 8:56 AM

## 2016-04-09 NOTE — Progress Notes (Signed)
Attempted to visit Annette Lambert and her husband to introduce spiritual care services and offer support after their surprise delivery by c-section this morning.  Pt and her husband were sleeping.  RN reports they are doing well and she will notify the pt of my attempt to visit.    Please page as further needs arise.  [name], M.Div. [facility] Chaplain Pager [phone] Office [phone]

## 2016-04-09 NOTE — Progress Notes (Signed)
Lab tech at bedside drawing stat CBC

## 2016-04-09 NOTE — Op Note (Signed)
Cesarean Section Operative Report  [name]  04/08/2016 - 04/09/2016  Indications: Placental abruption with PPROM   Pre-operative Diagnosis: c-section abruption, non reassuring fetal heart rate.   Post-operative Diagnosis: Same   Surgeon: Surgeon(s) and Role:    * Levie HeritageJacob J Stinson, DO - Primary     * Cleda ClarksElizabeth W Narayan Scull, DO - Assistant  Attending Attestation: I was present and scrubbed for the entire procedure.   Assistants: Jen MowElizabeth Aziz Slape, DO  Anesthesia: spinal    Estimated Blood Loss: 1700 ml  Total IV Fluids: 2500 ml LR  Urine Output:: 600 ml clear yellow urine  Specimens: None  Findings: Viable female infant in cephalic presentation; Apgars 5/7; weight 1360 g; arterial cord pH pending; clear amniotic fluid; intact placenta with three vessel cord; normal uterus, fallopian tubes and ovaries bilaterally.  Baby condition / location:  NICU   Complications: Blood loss  Indications: Annette MooreSahar Rhyne is a 35 y.o. [id number] with an IUP 635w2d presenting for PPROM with placental abruption.  The risks, benefits, complications, treatment options, and exected outcomes were discussed with the patient . The patient dwith the proposed plan, giving informed consent. identified as Annette Lambert and the procedure verified as C-Section Delivery.  Procedure Details:  The patient was taken back to the operative suite where spinal anesthesia was placed. Shortly after/during placement of anesthesia, a large amount of blood was coming per vagina, and the cesarean became urgently performed.  After induction of anesthesia, the patient was draped and prepped with splashed sterile betadine and placed in a dorsal supine position with a leftward tilt. A Pfannenstiel incision was made with scalpel and carried down through the subcutaneous tissue to the fascia. Fascial incision was made and bluntly extended transversely. The fascia was separated from the underlying rectus tissue superiorly and inferiorly.  The peritoneum was identified and entered and extended longitudinally. A low transverse uterine incision was made and extended bluntly, when a baseball sized clot immediately was expressed. Delivered from cephalic presentation was a viable infant with Apgars and weight as above.  After waiting 20 seconds for delayed cord cutting, the umbilical cord was clamped and cut cord blood was obtained for evaluation. Cord ph was sent. The placenta was removed Intact and appeared normal with noted abruption/clot upon entering the uterine cavity. The uterine outline, tubes and ovaries appeared normal. The uterine incision was closed with running locked sutures of 0Vicryl with an imbricating layer of the same.   Hemostasis was observed. The peritoneum was closed with 0 Vicryl. The rectus muscles were examined and hemostasis observed. The fascia was then reapproximated with running sutures of 2-0Vicryl.  The skin was closed with 4-0Vicryl.   Instrument, sponge, and needle counts were correct prior the abdominal closure and were correct at the conclusion of the case.     Disposition: PACU - hemodynamically stable.   Maternal Condition: stable       Signed: Jen MowElizabeth Shanon Becvar, DO  - OB Fellow - 04/09/2016 10:04 AM

## 2016-04-09 NOTE — Anesthesia Procedure Notes (Signed)
Spinal  Patient location during procedure: OR Start time: 04/09/2016 9:00 AM End time: 04/09/2016 9:03 AM Staffing Anesthesiologist: Annette Lambert, Annette Totzke S Performed: anesthesiologist  Preanesthetic Checklist Completed: patient identified, site marked, surgical consent, pre-op evaluation, timeout performed, IV checked, risks and benefits discussed and monitors and equipment checked Spinal Block Patient position: sitting Prep: DuraPrep Patient monitoring: heart rate, cardiac monitor, continuous pulse ox and blood pressure Location: L3-4 Injection technique: single-shot Needle Needle type: Pencil-Tip  Needle gauge: 25 G Assessment Sensory level: T4

## 2016-04-09 NOTE — Anesthesia Preprocedure Evaluation (Signed)
Anesthesia Evaluation  Patient identified by MRN, date of birth, ID band Patient awake    Reviewed: Allergy & Precautions, NPO status , Patient's Chart, lab work & pertinent test results  Airway Mallampati: I  TM Distance: >3 FB Neck ROM: Full    Dental no notable dental hx.    Pulmonary neg pulmonary ROS,    Pulmonary exam normal        Cardiovascular negative cardio ROS Normal cardiovascular exam     Neuro/Psych negative neurological ROS  negative psych ROS   GI/Hepatic negative GI ROS, Neg liver ROS,   Endo/Other  negative endocrine ROS  Renal/GU negative Renal ROS  negative genitourinary   Musculoskeletal negative musculoskeletal ROS (+)   Abdominal   Peds negative pediatric ROS (+)  Hematology  (+) anemia ,   Anesthesia Other Findings Suspected abrupt placenta  Reproductive/Obstetrics (+) Pregnancy                             Anesthesia Physical Anesthesia Plan  ASA: III and emergent  Anesthesia Plan: Spinal   Post-op Pain Management:    Induction:   Airway Management Planned:   Additional Equipment:   Intra-op Plan:   Post-operative Plan:   Informed Consent:   Plan Discussed with:   Anesthesia Plan Comments:         Anesthesia Quick Evaluation 35 year old with suspected placenta abruption. FHT look fine and per Ob, non-emergent. Will plan spinal unless signs of worsening abruption/fetal heart decline. Type and cross 4 units PRBCs.

## 2016-04-10 ENCOUNTER — Encounter (HOSPITAL_COMMUNITY): Payer: Self-pay | Admitting: Family Medicine

## 2016-04-10 LAB — CBC
HEMATOCRIT: 28.9 % — AB (ref 36.0–46.0)
HEMOGLOBIN: 9.6 g/dL — AB (ref 12.0–15.0)
MCH: 28.1 pg (ref 26.0–34.0)
MCHC: 33.2 g/dL (ref 30.0–36.0)
MCV: 84.5 fL (ref 78.0–100.0)
Platelets: 208 10*3/uL (ref 150–400)
RBC: 3.42 MIL/uL — ABNORMAL LOW (ref 3.87–5.11)
RDW: 14.8 % (ref 11.5–15.5)
WBC: 9.8 10*3/uL (ref 4.0–10.5)

## 2016-04-10 LAB — RPR: RPR: NONREACTIVE

## 2016-04-10 MED ORDER — ONDANSETRON HCL 4 MG PO TABS: 4.0000 mg | ORAL_TABLET | Freq: Three times a day (TID) | ORAL | Status: DC | PRN

## 2016-04-10 MED ORDER — POLYSACCHARIDE IRON COMPLEX 150 MG PO CAPS
150.0000 mg | ORAL_CAPSULE | Freq: Two times a day (BID) | ORAL | Status: DC
  Administered 2016-04-10 – 2016-04-12 (×5): 150 mg via ORAL
  Filled 2016-04-10 (×7): qty 1

## 2016-04-10 NOTE — Progress Notes (Signed)
Subjective: Postpartum Day #1: PLTCS for placental abruption after amnio-reduction.  Cesarean Delivery Patient reports nausea, vomiting and incisional pain.    Objective: Vital signs in last 24 hours: Temp:  [97.6 F (36.4 C)-98.7 F (37.1 C)] 97.9 F (36.6 C) (09/12 0539) Pulse Rate:  [75-89] 75 (09/12 0539) Resp:  [10-18] 18 (09/12 0539) BP: (87-99)/(51-66) 90/57 (09/12 0539) SpO2:  [98 %-100 %] 100 % (09/12 0539)  Physical Exam:  General: alert, cooperative and no distress Lochia: appropriate Uterine Fundus: firm Incision: no significant drainage DVT Evaluation: No evidence of DVT seen on physical exam. Negative Homan's sign. No cords or calf tenderness. No significant calf/ankle edema.   Recent Labs  04/09/16 1822 04/10/16 0550  HGB 9.1* 9.6*  HCT 27.4* 28.9*    Assessment/Plan: Status post Cesarean section. Doing well postoperatively. Anemia: Niferex ordered.  N&V: zofran ordered. Continue current care.  Annette Lambert, CNM 04/10/2016, 8:53 AM

## 2016-04-10 NOTE — Lactation Note (Addendum)
This note was copied from a baby's chart. Lactation Consultation Note  Patient Name: Annette Lambert WUJWJ'XToday's Date: 04/10/2016 Reason for consult: Initial assessment;NICU baby NICU baby 8733 hours old. Offered an interpreter, but mom declined and responded appropriately during the consultation. Mom states that she did not BF 3 older children, but her milk did "come in." Mom reports that she has been pumping every 2-3 hours and EMB has been taken to NICU. Reviewed importance of pumping regularly. Mom states that she is active with WIC, and gave permission [facility] and cost. [facility] given [facility], aware of OP/BFSG and LC phone line assistance after D/C.   Maternal Data Has patient been taught Hand Expression?: Yes (Per mom.) Does the patient have breastfeeding experience prior to this delivery?: No  Feeding Feeding Type: Formula Length of feed: 30 min  LATCH Score/Interventions                      Lactation Tools Discussed/Used WIC Program: Yes Pump Review: Setup, frequency, and cleaning;Milk Storage Initiated by:: bedside RN Date initiated:: 04/09/16   Consult Status Consult Status: Follow-up Date: 04/11/16 Follow-up type: In-patient    Sherlyn HayJennifer D Elverna Caffee 04/10/2016, 6:38 PM

## 2016-04-11 MED ORDER — MAGNESIUM HYDROXIDE 400 MG/5ML PO SUSP: 30.0000 mL | Freq: Every day | ORAL | Status: DC | PRN

## 2016-04-11 MED ORDER — POLYETHYLENE GLYCOL 3350 17 G PO PACK
17.0000 g | PACK | Freq: Every day | ORAL | Status: DC
Start: 2016-04-11 — End: 2016-04-12
  Administered 2016-04-11 – 2016-04-12 (×2): 17 g via ORAL
  Filled 2016-04-11 (×2): qty 1

## 2016-04-11 NOTE — Lactation Note (Signed)
This note was copied from a baby's chart. Lactation Consultation Note  Patient Name: Annette Lambert ZOXWR'UToday's Date: 04/11/2016 Reason for consult: Follow-up assessment;NICU baby  NICU baby 7748 hours old. Mom reports that she has pumped once this morning. Enc mom to pump every 2-3 hours for a total of 8 times/24 hours followed by hand expression. Mom given additional colostrum containers, and reports that she has taken EBM to NICU and it was given to the baby. Enc mom to offer STS as she and baby are able, as well as nuzzling at the breast.    Maternal Data    Feeding    LATCH Score/Interventions                      Lactation Tools Discussed/Used     Consult Status Consult Status: Follow-up Date: 04/12/16 Follow-up type: In-patient    Sherlyn HayJennifer D Lakeyshia Tuckerman 04/11/2016, 10:07 AM

## 2016-04-11 NOTE — Progress Notes (Addendum)
Subjective: Postpartum Day #2: Cesarean Delivery Patient reports incisional pain, tolerating PO, + flatus and no problems voiding.  Reports increased PO intake with antiemetics.  Reports feeling like she needs to have a bowel movement.  ESL, refuses translator.  Arabic speaking.   Objective: Vital signs in last 24 hours: Temp:  [98.2 F (36.8 C)-98.8 F (37.1 C)] 98.7 F (37.1 C) (09/13 0539) Pulse Rate:  [82-89] 89 (09/13 0539) Resp:  [16-18] 16 (09/13 0539) BP: (91-97)/(52-66) 97/52 (09/13 0539) SpO2:  [100 %] 100 % (09/13 0539)  Physical Exam:  General: alert, cooperative and no distress Lochia: appropriate Uterine Fundus: firm Incision: healing well, no significant drainage, no dehiscence, no significant erythema DVT Evaluation: No evidence of DVT seen on physical exam. No cords or calf tenderness. No significant calf/ankle edema.   Recent Labs  04/09/16 1822 04/10/16 0550  HGB 9.1* 9.6*  HCT 27.4* 28.9*    Assessment/Plan: Status post Cesarean section. Doing well postoperatively.  Continue current care.  Anemia: on Niferex.  Constipation: MOM/Miralax ordered. SW consult placed for NICU infant.   Roe Coombsachelle A Amariah Kierstead, CNM 04/11/2016, 9:07 AM

## 2016-04-11 NOTE — Progress Notes (Signed)
Family was very hopeful and positive when I saw them on 9/12. Although they have other children, Mom reported that this was her first C/Section and first NICU experience.  On the whole, they were coping well with the situation and were grateful for all of the support they received. They did not report any particular concerns at this time.  Chaplain Dyanne CarrelKaty Anicia Leuthold, Bcc Pager, [phone] 4:05 PM    04/11/16 1500  Clinical Encounter Type  Visited With Patient and family together  Visit Type Follow-up  Referral From Chaplain  Spiritual Encounters  Spiritual Needs Prayer;Emotional

## 2016-04-11 NOTE — Clinical Social Work Maternal (Signed)
  CLINICAL SOCIAL WORK MATERNAL/CHILD NOTE  Patient Details  Name: Annette Lambert MRN: [id number] Date of Birth: [birth year]  Date:  04/11/2016  Clinical Social Worker Initiating Note:  Laurey Arrow Date/ Time Initiated:  04/11/16/1023     Child's Name:  Annette Lambert   Legal Guardian:  Mother   Need for Interpreter:  None   Date of Referral:  04/10/16     Reason for Referral:  Other (Comment) (NICU Admission)   Referral Source:  NICU   Address:  [address] [place] [address]  Phone number:  [phone]   Household Members:  Self, Minor Children, Spouse   Natural Supports (not living in the home):  Spouse/significant other, Friends   Chiropodist: None   Employment: Unemployed   Type of Work:     Education:  Database administrator Resources:  [employer], Multimedia programmer   Other Resources:  [facility]   Cultural/Religious Considerations Which May Impact Care:  None reported.  Strengths:  Ability to meet basic needs , Home prepared for child , Pediatrician chosen    Risk Factors/Current Problems:  None   Cognitive State:  Alert , Insightful , Linear Thinking    Mood/Affect:  Bright , Happy , Interested , Comfortable    CSW Assessment: CSW met with MOB to complete an assessment for NICU admission.  MOB introduced her room guest as FOB/Husband Archivist).  MOB gave CSW permission to speak with MOB while FOB was present.  Both parents were polite, concerned about infant, and interested in meeting with CSW.  CSW inquired about hx of SA and MH; MOB denied both.  CSW also offered community in-home visiting programs and parents declined resources.  CSW shared with MOB and FOB the infant's eligibility for SSI benefits.  CSW provided the family with necessary documents for the application process and encouraged them to make an appointment as soon as possible. MOB and FOB communicated they have everything they need for the infant  including a car seat and safe place for the infant to sleep.  CSW educated MOB about PPD and MOB denied having any PPD signs and symptoms with MOB's older children. CSW thanked MOB and FOB for meeting with CSW and provided them with contact information for future reference.  CSW will continue to assess the family's needs, barriers, and concerns while infant is in [place].   CSW Plan/Description:  Patient/Family Education , No Further Intervention Required/No Barriers to Discharge, Psychosocial Support and Ongoing Assessment of Needs, Information/Referral to [place], MSW, [employer] Social Work [phone]     Dimple Nanas, LCSW 04/11/2016, 10:26 AM

## 2016-04-12 ENCOUNTER — Encounter: Payer: 59 | Admitting: Advanced Practice Midwife

## 2016-04-12 ENCOUNTER — Ambulatory Visit (HOSPITAL_COMMUNITY): Payer: Medicaid Other

## 2016-04-12 LAB — TYPE AND SCREEN
ABO/RH(D): O POS
Antibody Screen: NEGATIVE
UNIT DIVISION: 0
UNIT DIVISION: 0
Unit division: 0
Unit division: 0

## 2016-04-12 MED ORDER — OXYCODONE-ACETAMINOPHEN 5-325 MG PO TABS: 1.0000 | ORAL_TABLET | ORAL | 0 refills | Status: DC | PRN

## 2016-04-12 MED ORDER — IBUPROFEN 600 MG PO TABS: 600.0000 mg | ORAL_TABLET | Freq: Four times a day (QID) | ORAL | 2 refills | Status: DC

## 2016-04-12 MED ORDER — COCONUT OIL OIL: 1.0000 "application " | TOPICAL_OIL | 99 refills | Status: DC | PRN

## 2016-04-12 MED ORDER — SENNOSIDES-DOCUSATE SODIUM 8.6-50 MG PO TABS: 2.0000 | ORAL_TABLET | ORAL | 1 refills | Status: DC

## 2016-04-12 MED ORDER — POLYSACCHARIDE IRON COMPLEX 150 MG PO CAPS: 150.0000 mg | ORAL_CAPSULE | Freq: Two times a day (BID) | ORAL | 2 refills | Status: DC

## 2016-04-12 NOTE — Progress Notes (Signed)
Pt called at [phone] and [phone]  Because pt  Left prescriptions  And instructions   Papers  Signed at time of teaching   Husband states he will be back this pm to get papers

## 2016-04-12 NOTE — Discharge Summary (Signed)
Obstetric Discharge Summary Reason for Admission: Polyhydramnios with amnioreduction, placental abruption and PLTCS Prenatal Procedures: amniocentesis, NST and ultrasound Intrapartum Procedures: cesarean: low cervical, transverse Postpartum Procedures: none Complications-Operative and Postpartum: hemorrhage Hemoglobin  Date Value Ref Range Status  04/10/2016 9.6 (L) 12.0 - 15.0 g/dL Final   HCT  Date Value Ref Range Status  04/10/2016 28.9 (L) 36.0 - 46.0 % Final   Hematocrit  Date Value Ref Range Status  03/15/2016 34.3 34.0 - 46.6 % Final    Physical Exam:  General: alert, cooperative and no distress Lochia: appropriate Uterine Fundus: firm Incision: healing well, no significant drainage, no dehiscence, no significant erythema DVT Evaluation: No evidence of DVT seen on physical exam. Negative Homan's sign. No cords or calf tenderness. No significant calf/ankle edema.  Discharge Diagnoses: Preterm delivery for placental abruption, polyhydramnios, AMA, PLTCS, hemorrhage  Discharge Information: Date: 04/12/2016 Activity: pelvic rest Diet: routine Medications: PNV, Ibuprofen, Colace, Iron and Percocet Condition: stable Instructions: refer to practice specific booklet Discharge to: home Follow-up Information    Spencer Peterkin A Cambell Rickenbach, CNM Follow up in 2 week(s).   Specialty:  Certified Nurse Midwife Contact information: [address] [place] [address] [phone]           Newborn Data: Live born female  Birth Weight: 3 lb (1360 g) APGAR: 5, 7    Makenlee Mckeag A Fatmata Legere, CNM 04/12/2016, 8:26 AM

## 2016-04-12 NOTE — Progress Notes (Signed)
Pt teaching complete  With husband present  Pt states  They need to get to [place] because baby is transferring   A friend is with pt and husband   Instructed to remove dressing in  3 more days

## 2016-04-12 NOTE — Lactation Note (Signed)
This note was copied from a baby's chart. Lactation Consultation Note  Patient Name: Annette Lambert Date: 04/12/2016 Reason for consult: Follow-up assessment NICU baby 91 hours old. Accompanied by Arabic interpreter "Aseel" [phone]. Mom is just finishing pumping and has about 1 ounce of transitional EBM. Mom and FOB report that [facility] is coming to the hospital to bring a pump today at 1430. Gave parents [place] number to call and determine if they are to be at [place] office to pick up the DEBP. Mom is aware of pumping rooms in NICU and knows to take her kit with her when she leaves, and then bring kit back for use in NICU. Discussed engorgement prevention/treatment, EBM storage guidelines, and how to transport EBM back to NICU. Mom is also aware of OP/BFSG and [place] phone line assistance after D/C.   Maternal Data    Feeding    LATCH Score/Interventions                      Lactation Tools Discussed/Used     Consult Status Consult Status: PRN    Andres Labrum 04/12/2016, 11:56 AM

## 2016-04-19 ENCOUNTER — Ambulatory Visit (HOSPITAL_COMMUNITY): Payer: Medicaid Other

## 2016-04-26 ENCOUNTER — Ambulatory Visit (HOSPITAL_COMMUNITY): Payer: Medicaid Other

## 2016-05-04 ENCOUNTER — Ambulatory Visit (INDEPENDENT_AMBULATORY_CARE_PROVIDER_SITE_OTHER): Payer: 59 | Admitting: Obstetrics

## 2016-05-04 DIAGNOSIS — Z3009 Encounter for other general counseling and advice on contraception: Secondary | ICD-10-CM | POA: Diagnosis not present

## 2016-05-07 ENCOUNTER — Encounter: Payer: Self-pay | Admitting: Obstetrics

## 2016-05-07 NOTE — Progress Notes (Signed)
Subjective:     Annette Lambert is a 35 y.o. female who presents for a postpartum visit. She is 3 weeks postpartum following a low cervical transverse Cesarean section. I have fully reviewed the prenatal and intrapartum course. The delivery was at 32 gestational weeks. Outcome: primary cesarean section, low transverse incision. Anesthesia: spinal. Postpartum course has been normal.  Bleeding thin lochia. Bowel function is normal. Bladder function is normal. Patient is not sexually active. Contraception method is abstinence. Postpartum depression screening: negative.  Tobacco, alcohol and substance abuse history reviewed.  Adult immunizations reviewed including TDAP, rubella and varicella.  The following portions of the patient's history were reviewed and updated as appropriate: allergies, current medications, past family history, past medical history, past social history, past surgical history and problem list.  Review of Systems A comprehensive review of systems was negative.   Objective:    BP 108/76   Pulse (!) 106   Temp 98.6 F (37 C) (Oral)   Ht 5\' 7"  (1.702 m)   Wt 157 lb 1.6 oz (71.3 kg)   LMP 08/27/2015   Breastfeeding? No   BMI 24.61 kg/m   General:  alert and no distress   Breasts:  inspection negative, no nipple discharge or bleeding, no masses or nodularity palpable  Lungs: clear to auscultation bilaterally  Heart:  regular rate and rhythm, S1, S2 normal, no murmur, click, rub or gallop  Abdomen: normal findings: soft, non-tender and incision C, D, I                Extremities:  N0 C, C, E.   50% of 15 min visit spent on counseling and coordination of care.  Assessment:    3 weeks postpartum.  Doing well.  Contraceptive counseling and advice.  Does not desire contraception.  Plan:    1. Contraception: none 2. Continue PNV's 3. Follow up in: 4 weeks or as needed.    Healthy lifestyle practices reviewed

## 2016-06-06 ENCOUNTER — Ambulatory Visit (INDEPENDENT_AMBULATORY_CARE_PROVIDER_SITE_OTHER): Payer: 59 | Admitting: Certified Nurse Midwife

## 2016-06-06 ENCOUNTER — Encounter: Payer: Self-pay | Admitting: Certified Nurse Midwife

## 2016-06-06 VITALS — BP 118/84 | HR 79 | Wt 169.0 lb

## 2016-06-06 DIAGNOSIS — Z862 Personal history of diseases of the blood and blood-forming organs and certain disorders involving the immune mechanism: Secondary | ICD-10-CM

## 2016-06-06 MED ORDER — POLYSACCHARIDE IRON COMPLEX 150 MG PO CAPS: 150.0000 mg | ORAL_CAPSULE | Freq: Every day | ORAL | 2 refills | Status: DC

## 2016-06-06 NOTE — Progress Notes (Signed)
Post Partum Exam  [name] is a 35 y.o. [id number] female who presents for a postpartum visit. She is 8 weeks postpartum following a low cervical transverse Cesarean section. I have fully reviewed the prenatal and intrapartum course. The delivery was at 32 gestational weeks.  Anesthesia: spinal. Postpartum course has been doing well. Baby passed away at 368-959 days of age. Bleeding no bleeding. Bowel function is normal. Bladder function is normal. Patient is sexually active. Contraception method is none. Postpartum depression screening:neg.   Last pap smear was 11/23/15: normal.  Here for exam with interpreter.    The following portions of the patient's history were reviewed and updated as appropriate: allergies, current medications, past family history, past medical history, past social history, past surgical history and problem list.  Review of Systems Pertinent items noted in HPI and remainder of comprehensive ROS otherwise negative.    Objective:    BP 116/78 mmHg  Pulse 78  Resp 16  Ht 5\' 5"  (1.651 m)  Wt 211 lb (95.709 kg)  BMI 35.11 kg/m2  Breastfeeding? Yes  General:  alert, cooperative and no distress   Breasts:  inspection negative, no nipple discharge or bleeding, no masses or nodularity palpable  Lungs: clear to auscultation bilaterally  Heart:  regular rate and rhythm, S1, S2 normal, no murmur, click, rub or gallop  Abdomen: soft, non-tender; bowel sounds normal; no masses,  no organomegaly   Vulva:  normal        Assessment:    Normal postpartum exam. Pap smear not done at today's visit.   H/O anemia: recheck CBC today  Plan:   1. Contraception: none 2. Natural family planning methods discussed.   3. Follow up in: 4 months for annual exam or as needed.

## 2016-06-06 NOTE — Patient Instructions (Addendum)
Anemia, Nonspecific Anemia is a condition in which the concentration of red blood cells or hemoglobin in the blood is below normal. Hemoglobin is a substance in red blood cells that carries oxygen to the tissues of the body. Anemia results in not enough oxygen reaching these tissues.  CAUSES  Common causes of anemia include:   Excessive bleeding. Bleeding may be internal or external. This includes excessive bleeding from periods (in women) or from the intestine.   Poor nutrition.   Chronic kidney, thyroid, and liver disease.  Bone marrow disorders that decrease red blood cell production.  Cancer and treatments for cancer.  HIV, AIDS, and their treatments.  Spleen problems that increase red blood cell destruction.  Blood disorders.  Excess destruction of red blood cells due to infection, medicines, and autoimmune disorders. SIGNS AND SYMPTOMS   Minor weakness.   Dizziness.   Headache.  Palpitations.   Shortness of breath, especially with exercise.   Paleness.  Cold sensitivity.  Indigestion.  Nausea.  Difficulty sleeping.  Difficulty concentrating. Symptoms may occur suddenly or they may develop slowly.  DIAGNOSIS  Additional blood tests are often needed. These help your health care provider determine the best treatment. Your health care provider will check your stool for blood and look for other causes of blood loss.  TREATMENT  Treatment varies depending on the cause of the anemia. Treatment can include:   Supplements of iron, vitamin B12, or folic acid.   Hormone medicines.   A blood transfusion. This may be needed if blood loss is severe.   Hospitalization. This may be needed if there is significant continual blood loss.   Dietary changes.  Spleen removal. HOME CARE INSTRUCTIONS Keep all follow-up appointments. It often takes many weeks to correct anemia, and having your health care provider check on your condition and your response to  treatment is very important. SEEK IMMEDIATE MEDICAL CARE IF:   You develop extreme weakness, shortness of breath, or chest pain.   You become dizzy or have trouble concentrating.  You develop heavy vaginal bleeding.   You develop a rash.   You have bloody or black, tarry stools.   You faint.   You vomit up blood.   You vomit repeatedly.   You have abdominal pain.  You have a fever or persistent symptoms for more than 2-3 days.   You have a fever and your symptoms suddenly get worse.   You are dehydrated.  MAKE SURE YOU:  Understand these instructions.  Will watch your condition.  Will get help right away if you are not doing well or get worse.   This information is not intended to replace advice given to you by your health care provider. Make sure you discuss any questions you have with your health care provider.   Document Released: 08/23/2004 Document Revised: 03/18/2013 Document Reviewed: 01/09/2013 Elsevier Interactive Patient Education 2016 Elsevier Inc.  

## 2016-06-07 LAB — CBC
Hematocrit: 39.6 % (ref 34.0–46.6)
Hemoglobin: 12.7 g/dL (ref 11.1–15.9)
MCH: 27.3 pg (ref 26.6–33.0)
MCHC: 32.1 g/dL (ref 31.5–35.7)
MCV: 85 fL (ref 79–97)
PLATELETS: 327 10*3/uL (ref 150–379)
RBC: 4.65 x10E6/uL (ref 3.77–5.28)
RDW: 15.9 % — AB (ref 12.3–15.4)
WBC: 7.4 10*3/uL (ref 3.4–10.8)

## 2016-10-25 ENCOUNTER — Ambulatory Visit (INDEPENDENT_AMBULATORY_CARE_PROVIDER_SITE_OTHER): Payer: 59 | Admitting: Certified Nurse Midwife

## 2016-10-25 ENCOUNTER — Encounter: Payer: Self-pay | Admitting: Certified Nurse Midwife

## 2016-10-25 ENCOUNTER — Encounter: Payer: Self-pay | Admitting: *Deleted

## 2016-10-25 ENCOUNTER — Other Ambulatory Visit (HOSPITAL_COMMUNITY)
Admission: RE | Admit: 2016-10-25 | Discharge: 2016-10-25 | Disposition: A | Discharge status: Home / Self Care | Payer: 59 | Source: Ambulatory Visit | Attending: Certified Nurse Midwife | Admitting: Certified Nurse Midwife

## 2016-10-25 VITALS — BP 115/80 | HR 71 | Temp 97.1°F | Wt 155.8 lb

## 2016-10-25 DIAGNOSIS — Z3481 Encounter for supervision of other normal pregnancy, first trimester: Secondary | ICD-10-CM | POA: Diagnosis not present

## 2016-10-25 DIAGNOSIS — O0993 Supervision of high risk pregnancy, unspecified, third trimester: Secondary | ICD-10-CM | POA: Insufficient documentation

## 2016-10-25 DIAGNOSIS — O34219 Maternal care for unspecified type scar from previous cesarean delivery: Secondary | ICD-10-CM

## 2016-10-25 DIAGNOSIS — O0990 Supervision of high risk pregnancy, unspecified, unspecified trimester: Secondary | ICD-10-CM | POA: Diagnosis not present

## 2016-10-25 DIAGNOSIS — Z789 Other specified health status: Secondary | ICD-10-CM | POA: Insufficient documentation

## 2016-10-25 DIAGNOSIS — Z98891 History of uterine scar from previous surgery: Secondary | ICD-10-CM | POA: Insufficient documentation

## 2016-10-25 DIAGNOSIS — O09521 Supervision of elderly multigravida, first trimester: Secondary | ICD-10-CM

## 2016-10-25 DIAGNOSIS — O09523 Supervision of elderly multigravida, third trimester: Secondary | ICD-10-CM | POA: Insufficient documentation

## 2016-10-25 MED ORDER — PROVIDA DHA 16-16-1.25-110 MG PO CAPS: 1.0000 | ORAL_CAPSULE | Freq: Every day | ORAL | 12 refills | Status: DC

## 2016-10-25 NOTE — Progress Notes (Signed)
Patient is in the office for initial ob visit, complains of ocassional pain on right side, denies bleeding.

## 2016-10-25 NOTE — Progress Notes (Signed)
Subjective:    Annette MooreSahar Lambert is being seen today for her first obstetrical visit.  This is a planned pregnancy. She is at 2465w3d gestation. Her obstetrical history is significant for advanced maternal age and previous C-section: LTCS @33wks  for PPROM and possible abruption.  Hx of polyhydramnios last pregnancy.  Infant died from the last delivery at 9 days of life, kidney failure.  . Relationship with FOB: spouse, living together. Patient does intend to breast feed. Pregnancy history fully reviewed.  The information documented in the HPI was reviewed and verified.  Menstrual History: OB History    Gravida Para Term Preterm AB Living   6 4 3 1 1 3    SAB TAB Ectopic Multiple Live Births   1 0 0 0 4       Patient's last menstrual period was 08/06/2016.    Past Medical History:  Diagnosis Date  . No pertinent past medical history     Past Surgical History:  Procedure Laterality Date  . CESAREAN SECTION N/A 04/09/2016   Procedure: CESAREAN SECTION;  Surgeon: Levie HeritageJacob J Stinson, DO;  [facility];  Service: Obstetrics;  Laterality: N/A;  . NO PAST SURGERIES       (Not in a hospital admission) No Known Allergies  Social History  Substance Use Topics  . Smoking status: Never Smoker  . Smokeless tobacco: Never Used  . Alcohol use No    History reviewed. No pertinent family history.   Review of Systems Constitutional: negative for weight loss Gastrointestinal: negative for vomiting Genitourinary:negative for genital lesions and vaginal discharge and dysuria Musculoskeletal:negative for back pain Behavioral/Psych: negative for abusive relationship, depression, illegal drug usage and tobacco use    Objective:    BP 115/80   Pulse 71   Temp 97.1 F (36.2 C)   Wt 155 lb 12.8 oz (70.7 kg)   LMP 08/06/2016   BMI 24.40 kg/m  General Appearance:    Alert, cooperative, no distress, appears stated age  Head:    Normocephalic, without obvious abnormality, atraumatic   Eyes:    PERRL, conjunctiva/corneas clear, EOM's intact, fundi    benign, both eyes  Ears:    Normal TM's and external ear canals, both ears  Nose:   Nares normal, septum midline, mucosa normal, no drainage    or sinus tenderness  Throat:   Lips, mucosa, and tongue normal; teeth and gums normal  Neck:   Supple, symmetrical, trachea midline, no adenopathy;    thyroid:  no enlargement/tenderness/nodules; no carotid   bruit or JVD  Back:     Symmetric, no curvature, ROM normal, no CVA tenderness  Lungs:     Clear to auscultation bilaterally, respirations unlabored  Chest Wall:    No tenderness or deformity   Heart:    Regular rate and rhythm, S1 and S2 normal, no murmur, rub   or gallop  Breast Exam:    No tenderness, masses, or nipple abnormality  Abdomen:     Soft, non-tender, bowel sounds active all four quadrants,    no masses, no organomegaly  Genitalia:    Normal female without lesion, discharge or tenderness  Extremities:   Extremities normal, atraumatic, no cyanosis or edema  Pulses:   2+ and symmetric all extremities  Skin:   Skin color, texture, turgor normal, no rashes or lesions  Lymph nodes:   Cervical, supraclavicular, and axillary nodes normal  Neurologic:   CNII-XII intact, normal strength, sensation and reflexes    throughout  Cervix:   Long, thick, closed and posterior.  FHR: 158 by doppler.  Size c/w dates.     Lab Review Urine pregnancy test Labs reviewed yes Radiologic studies reviewed no Assessment:    Pregnancy at [redacted]w[redacted]d weeks     Supervision of high risk pregnancy, antepartum - Plan: Cervicovaginal ancillary only, Culture, OB Urine, Obstetric Panel, Including HIV, Hemoglobinopathy evaluation, Hemoglobin A1c, Varicella zoster antibody, IgG, Vitamin D (25 hydroxy), Cystic Fibrosis Mutation 97, AMB referral to maternal fetal medicine, [place] MFM OB DETAIL +14 WK, Prenat-FeFum-FePo-FA-DHA w/o A (PROVIDA DHA) 16-16-1.25-110 MG CAPS, CANCELED: MaterniT  Genome  Elderly multigravida in first trimester  Non-English speaking patient  History of C-section   Plan:    Order 17-P TOLAC consent obtained.   Discussed last pregnancy with Dr. Debroah Loop: 17-P is indicated for premature delivery.   Prenatal vitamins.  Counseling provided regarding continued use of seat belts, cessation of alcohol consumption, smoking or use of illicit drugs; infection precautions i.e., influenza/TDAP immunizations, toxoplasmosis,CMV, parvovirus, listeria and varicella; workplace safety, exercise during pregnancy; routine dental care, safe medications, sexual activity, hot tubs, saunas, pools, travel, caffeine use, fish and methlymercury, potential toxins, hair treatments, varicose veins Weight gain recommendations per IOM guidelines reviewed: underweight/BMI< 18.5--> gain 28 - 40 lbs; normal weight/BMI 18.5 - 24.9--> gain 25 - 35 lbs; overweight/BMI 25 - 29.9--> gain 15 - 25 lbs; obese/BMI >30->gain  11 - 20 lbs Problem list reviewed and updated. FIRST/CF mutation testing/NIPT/QUAD SCREEN/fragile X/Ashkenazi Jewish population testing/Spinal muscular atrophy discussed: ordered. Role of ultrasound in pregnancy discussed; fetal survey: ordered. Amniocentesis discussed: not discussed\. VBAC calculator score:75% VBAC consent form provided Meds ordered this encounter  Medications  . Prenat-FeFum-FePo-FA-DHA w/o A (PROVIDA DHA) 16-16-1.25-110 MG CAPS    Sig: Take 1 tablet by mouth daily.    Dispense:  30 capsule    Refill:  12   Orders Placed This Encounter  Procedures  . Culture, OB Urine  . [place] MFM OB DETAIL +14 WK    Standing Status:   Future    Standing Expiration Date:   12/25/2017    Order Specific Question:   Reason for Exam (SYMPTOM  OR DIAGNOSIS REQUIRED)    Answer:   fetal anatomy scan    Order Specific Question:   Preferred imaging location?    Answer:   [facility]  . Obstetric Panel, Including HIV  . Hemoglobinopathy evaluation  . Hemoglobin A1c  .  Varicella zoster antibody, IgG  . Vitamin D (25 hydroxy)  . Cystic Fibrosis Mutation 97  . AMB referral to maternal fetal medicine    Referral Priority:   Routine    Referral Type:   Consultation    Referral Reason:   Specialty Services Required    Number of Visits Requested:   1    Follow up in 4 weeks. 50% of 45 min visit spent on counseling and coordination of care.

## 2016-10-26 LAB — CERVICOVAGINAL ANCILLARY ONLY
Bacterial vaginitis: NEGATIVE
CHLAMYDIA, DNA PROBE: NEGATIVE
Candida vaginitis: NEGATIVE
Neisseria Gonorrhea: NEGATIVE
TRICH (WINDOWPATH): NEGATIVE

## 2016-10-27 LAB — URINE CULTURE, OB REFLEX

## 2016-10-27 LAB — CULTURE, OB URINE

## 2016-10-29 LAB — HEMOGLOBIN A1C
Est. average glucose Bld gHb Est-mCnc: 108 mg/dL
HEMOGLOBIN A1C: 5.4 % (ref 4.8–5.6)

## 2016-10-29 LAB — OBSTETRIC PANEL, INCLUDING HIV
ANTIBODY SCREEN: NEGATIVE
BASOS: 0 %
Basophils Absolute: 0 10*3/uL (ref 0.0–0.2)
EOS (ABSOLUTE): 0 10*3/uL (ref 0.0–0.4)
Eos: 0 %
HEMOGLOBIN: 11.8 g/dL (ref 11.1–15.9)
HEP B S AG: NEGATIVE
HIV Screen 4th Generation wRfx: NONREACTIVE
Hematocrit: 35.5 % (ref 34.0–46.6)
IMMATURE GRANS (ABS): 0 10*3/uL (ref 0.0–0.1)
Immature Granulocytes: 0 %
LYMPHS ABS: 1.2 10*3/uL (ref 0.7–3.1)
LYMPHS: 19 %
MCH: 27.5 pg (ref 26.6–33.0)
MCHC: 33.2 g/dL (ref 31.5–35.7)
MCV: 83 fL (ref 79–97)
MONOS ABS: 0.3 10*3/uL (ref 0.1–0.9)
Monocytes: 5 %
Neutrophils Absolute: 4.4 10*3/uL (ref 1.4–7.0)
Neutrophils: 76 %
Platelets: 272 10*3/uL (ref 150–379)
RBC: 4.29 x10E6/uL (ref 3.77–5.28)
RDW: 14.3 % (ref 12.3–15.4)
RPR Ser Ql: NONREACTIVE
Rh Factor: POSITIVE
Rubella Antibodies, IGG: 20.1 index (ref >0.99)
WBC: 5.9 10*3/uL (ref 3.4–10.8)

## 2016-10-29 LAB — HEMOGLOBINOPATHY EVALUATION
HEMOGLOBIN F QUANTITATION: 0 % (ref 0.0–2.0)
HGB C: 0 %
HGB S: 0 %
HGB VARIANT: 0 %
Hemoglobin A2 Quantitation: 2.6 % (ref 1.8–3.2)
Hgb A: 97.4 % (ref 96.4–98.8)

## 2016-10-29 LAB — VITAMIN D 25 HYDROXY (VIT D DEFICIENCY, FRACTURES): VIT D 25 HYDROXY: 32.8 ng/mL (ref 30.0–100.0)

## 2016-10-29 LAB — VARICELLA ZOSTER ANTIBODY, IGG: VARICELLA: 2539 {index} (ref >165)

## 2016-10-30 ENCOUNTER — Encounter: Payer: Self-pay | Admitting: Obstetrics & Gynecology

## 2016-10-30 DIAGNOSIS — O09213 Supervision of pregnancy with history of pre-term labor, third trimester: Secondary | ICD-10-CM | POA: Insufficient documentation

## 2016-10-31 LAB — CYSTIC FIBROSIS MUTATION 97: Interpretation: NOT DETECTED

## 2016-11-01 ENCOUNTER — Other Ambulatory Visit: Payer: Self-pay | Admitting: Certified Nurse Midwife

## 2016-11-01 DIAGNOSIS — O0990 Supervision of high risk pregnancy, unspecified, unspecified trimester: Secondary | ICD-10-CM

## 2016-11-22 ENCOUNTER — Ambulatory Visit (INDEPENDENT_AMBULATORY_CARE_PROVIDER_SITE_OTHER): Payer: 59 | Admitting: Certified Nurse Midwife

## 2016-11-22 VITALS — BP 118/68 | HR 110 | Wt 157.0 lb

## 2016-11-22 DIAGNOSIS — O0992 Supervision of high risk pregnancy, unspecified, second trimester: Secondary | ICD-10-CM

## 2016-11-22 DIAGNOSIS — O09212 Supervision of pregnancy with history of pre-term labor, second trimester: Secondary | ICD-10-CM

## 2016-11-22 DIAGNOSIS — Z789 Other specified health status: Secondary | ICD-10-CM

## 2016-11-22 DIAGNOSIS — O09521 Supervision of elderly multigravida, first trimester: Secondary | ICD-10-CM

## 2016-11-22 DIAGNOSIS — Z98891 History of uterine scar from previous surgery: Secondary | ICD-10-CM

## 2016-11-22 DIAGNOSIS — O09219 Supervision of pregnancy with history of pre-term labor, unspecified trimester: Secondary | ICD-10-CM

## 2016-11-22 DIAGNOSIS — O09522 Supervision of elderly multigravida, second trimester: Secondary | ICD-10-CM

## 2016-11-22 DIAGNOSIS — O0990 Supervision of high risk pregnancy, unspecified, unspecified trimester: Secondary | ICD-10-CM

## 2016-11-22 NOTE — Progress Notes (Signed)
Clinical research associate Claudine [phone]

## 2016-11-22 NOTE — Progress Notes (Signed)
   PRENATAL VISIT NOTE  Subjective:  Annette Lambert is a 36 y.o. [id number] at [redacted]w[redacted]d being seen today for ongoing prenatal care.  She is currently monitored for the following issues for this high-risk pregnancy and has Supervision of high risk pregnancy, antepartum; AMA (advanced maternal age) multigravida 35+; Non-English speaking patient; History of C-section; and Previous preterm delivery, antepartum on her problem list.  Patient reports no complaints.  Contractions: Not present. Vag. Bleeding: None.   . Denies leaking of fluid.   The following portions of the patient's history were reviewed and updated as appropriate: allergies, current medications, past family history, past medical history, past social history, past surgical history and problem list. Problem list updated.  Objective:   Vitals:   11/22/16 1036  BP: 118/68  Pulse: (!) 110  Weight: 157 lb (71.2 kg)    Fetal Status: Fetal Heart Rate (bpm): 152         General:  Alert, oriented and cooperative. Patient is in no acute distress.  Skin: Skin is warm and dry. No rash noted.   Cardiovascular: Normal heart rate noted  Respiratory: Normal respiratory effort, no problems with respiration noted  Abdomen: Soft, gravid, appropriate for gestational age. Pain/Pressure: Absent     Pelvic:  Cervical exam deferred        Extremities: Normal range of motion.  Edema: None  Mental Status: Normal mood and affect. Normal behavior. Normal judgment and thought content.   Assessment and Plan:  Pregnancy: F6O1308 at [redacted]w[redacted]d  1. Supervision of high risk pregnancy, antepartum      17-P ordered per Dr. Debroah Loop - MaterniT Genome - AFP, Serum, Open Spina Bifida  2. Elderly multigravida in first trimester     [place] scheduled for 12/25/16.   3. Non-English speaking patient     Arabic video interpreter used.   4. History of C-section     TOLAC planned  5. Previous preterm delivery, antepartum     17-P ordered  Preterm labor symptoms and general  obstetric precautions including but not limited to vaginal bleeding, contractions, leaking of fluid and fetal movement were reviewed in detail with the patient. Please refer to After Visit Summary for other counseling recommendations.  Return in about 4 weeks (around 12/20/2016) for [place].  Weekly 17-P injections.    Roe Coombs, CNM

## 2016-11-26 ENCOUNTER — Other Ambulatory Visit: Payer: Self-pay | Admitting: Certified Nurse Midwife

## 2016-11-27 ENCOUNTER — Other Ambulatory Visit: Payer: Self-pay | Admitting: Certified Nurse Midwife

## 2016-11-27 DIAGNOSIS — O0990 Supervision of high risk pregnancy, unspecified, unspecified trimester: Secondary | ICD-10-CM

## 2016-11-27 LAB — AFP, SERUM, OPEN SPINA BIFIDA
AFP MoM: 0.69
AFP Value: 22.1 ng/mL
GEST. AGE ON COLLECTION DATE: 15.4 wk
Maternal Age At EDD: 36.7 yr
OSBR RISK 1 IN: 10000
Test Results:: NEGATIVE
WEIGHT: 157 [lb_av]

## 2016-11-29 ENCOUNTER — Other Ambulatory Visit: Payer: Self-pay | Admitting: Certified Nurse Midwife

## 2016-11-29 DIAGNOSIS — O0990 Supervision of high risk pregnancy, unspecified, unspecified trimester: Secondary | ICD-10-CM

## 2016-11-29 LAB — MATERNIT GENOME

## 2016-12-10 ENCOUNTER — Encounter: Payer: Self-pay | Admitting: *Deleted

## 2016-12-17 ENCOUNTER — Ambulatory Visit (INDEPENDENT_AMBULATORY_CARE_PROVIDER_SITE_OTHER): Payer: 59 | Admitting: Obstetrics and Gynecology

## 2016-12-17 VITALS — BP 111/71 | HR 76 | Wt 162.0 lb

## 2016-12-17 DIAGNOSIS — O09212 Supervision of pregnancy with history of pre-term labor, second trimester: Secondary | ICD-10-CM | POA: Diagnosis not present

## 2016-12-17 DIAGNOSIS — Z98891 History of uterine scar from previous surgery: Secondary | ICD-10-CM

## 2016-12-17 DIAGNOSIS — O09522 Supervision of elderly multigravida, second trimester: Secondary | ICD-10-CM

## 2016-12-17 DIAGNOSIS — Z789 Other specified health status: Secondary | ICD-10-CM

## 2016-12-17 DIAGNOSIS — O09219 Supervision of pregnancy with history of pre-term labor, unspecified trimester: Secondary | ICD-10-CM

## 2016-12-17 DIAGNOSIS — O0992 Supervision of high risk pregnancy, unspecified, second trimester: Secondary | ICD-10-CM

## 2016-12-17 DIAGNOSIS — O0990 Supervision of high risk pregnancy, unspecified, unspecified trimester: Secondary | ICD-10-CM

## 2016-12-17 MED ORDER — HYDROXYPROGESTERONE CAPROATE 250 MG/ML IM OIL
250.0000 mg | TOPICAL_OIL | Freq: Once | INTRAMUSCULAR | Status: AC
  Administered 2016-12-17: 250 mg via INTRAMUSCULAR

## 2016-12-17 NOTE — Progress Notes (Signed)
   PRENATAL VISIT NOTE  Subjective:  Annette Lambert is a 36 y.o. [id number] at 9352w0d being seen today for ongoing prenatal care.  She is currently monitored for the following issues for this high-risk pregnancy and has Supervision of high risk pregnancy, antepartum; AMA (advanced maternal age) multigravida 35+; Non-English speaking patient; History of C-section; and Previous preterm delivery, antepartum on her problem list.  Patient reports no complaints.  Contractions: Not present. Vag. Bleeding: None.   . Denies leaking of fluid.   The following portions of the patient's history were reviewed and updated as appropriate: allergies, current medications, past family history, past medical history, past social history, past surgical history and problem list. Problem list updated.  Objective:   Vitals:   12/17/16 1016  BP: 111/71  Pulse: 76  Weight: 162 lb (73.5 kg)    Fetal Status: Fetal Heart Rate (bpm): 138         General:  Alert, oriented and cooperative. Patient is in no acute distress.  Skin: Skin is warm and dry. No rash noted.   Cardiovascular: Normal heart rate noted  Respiratory: Normal respiratory effort, no problems with respiration noted  Abdomen: Soft, gravid, appropriate for gestational age. Pain/Pressure: Absent     Pelvic:  Cervical exam deferred        Extremities: Normal range of motion.  Edema: None  Mental Status: Normal mood and affect. Normal behavior. Normal judgment and thought content.   Assessment and Plan:  Pregnancy: [id number] at 4952w0d  1. Supervision of high risk pregnancy, antepartum Patient is doing well without complaints Follow up anatomy ultrasound on 5/29  2. Previous preterm delivery, antepartum Patient desires to start weekly 17-P injections after all questions were answered  3. Elderly multigravida in second trimester Low risk NIPS  4. History of C-section Desires TOLAC- consent already signed  5. Non-English speaking patient Arabic  interpreter used  General obstetric precautions including but not limited to vaginal bleeding, contractions, leaking of fluid and fetal movement were reviewed in detail with the patient. Please refer to After Visit Summary for other counseling recommendations.  Return in about 4 weeks (around 01/14/2017) for ROB.   Catalina AntiguaPeggy Arrion Broaddus, MD

## 2016-12-17 NOTE — Progress Notes (Signed)
Interpreter Zina [phone]

## 2016-12-18 ENCOUNTER — Telehealth: Payer: Self-pay | Admitting: *Deleted

## 2016-12-18 NOTE — Telephone Encounter (Signed)
They are having trouble getting in touch with patient to set up order. [facility]- the patient has a large copay and she speaks arabic only.

## 2016-12-25 ENCOUNTER — Other Ambulatory Visit: Payer: Self-pay | Admitting: Certified Nurse Midwife

## 2016-12-25 ENCOUNTER — Ambulatory Visit (HOSPITAL_COMMUNITY)
Admission: RE | Admit: 2016-12-25 | Discharge: 2016-12-25 | Disposition: A | Discharge status: Home / Self Care | Payer: 59 | Source: Ambulatory Visit | Attending: Certified Nurse Midwife | Admitting: Certified Nurse Midwife

## 2016-12-25 ENCOUNTER — Encounter (HOSPITAL_COMMUNITY): Payer: Self-pay

## 2016-12-25 ENCOUNTER — Other Ambulatory Visit (HOSPITAL_COMMUNITY): Payer: Self-pay | Admitting: *Deleted

## 2016-12-25 DIAGNOSIS — Z3A20 20 weeks gestation of pregnancy: Secondary | ICD-10-CM

## 2016-12-25 DIAGNOSIS — O34219 Maternal care for unspecified type scar from previous cesarean delivery: Secondary | ICD-10-CM | POA: Diagnosis not present

## 2016-12-25 DIAGNOSIS — O09899 Supervision of other high risk pregnancies, unspecified trimester: Secondary | ICD-10-CM

## 2016-12-25 DIAGNOSIS — Z369 Encounter for antenatal screening, unspecified: Secondary | ICD-10-CM | POA: Diagnosis present

## 2016-12-25 DIAGNOSIS — O289 Unspecified abnormal findings on antenatal screening of mother: Secondary | ICD-10-CM

## 2016-12-25 DIAGNOSIS — O09522 Supervision of elderly multigravida, second trimester: Secondary | ICD-10-CM

## 2016-12-25 DIAGNOSIS — O409XX0 Polyhydramnios, unspecified trimester, not applicable or unspecified: Secondary | ICD-10-CM

## 2016-12-25 DIAGNOSIS — Z8759 Personal history of other complications of pregnancy, childbirth and the puerperium: Secondary | ICD-10-CM | POA: Diagnosis not present

## 2016-12-25 DIAGNOSIS — Z148 Genetic carrier of other disease: Secondary | ICD-10-CM

## 2016-12-25 DIAGNOSIS — O09299 Supervision of pregnancy with other poor reproductive or obstetric history, unspecified trimester: Secondary | ICD-10-CM

## 2016-12-25 DIAGNOSIS — O09219 Supervision of pregnancy with history of pre-term labor, unspecified trimester: Secondary | ICD-10-CM

## 2016-12-25 DIAGNOSIS — O0990 Supervision of high risk pregnancy, unspecified, unspecified trimester: Secondary | ICD-10-CM

## 2016-12-25 NOTE — Progress Notes (Signed)
Maternal Fetal Medicine Consultation  Requesting Provider(s): Clearance CootsHarper  Primary OB: [facility] Reason for consultation: Previous child with delivery at 32 weeks with neonatal demise  HPI:36 yo P3103 at 20+1 weeks. Her last baby was born on 04/09/2016 1 week after diagnosis of extreme polyhydramnios and amnioreduction in our office. There was PPROM and abruption. After delivery [facility] deteriorated and [facility]. The baby subsequently died on the 9th day of life. She has completed a MaterniT Genome test with was low risk. She is currently on 17-hydroxyprogesterone OB History: OB History    Gravida Para Term Preterm AB Living   6 4 3 1 1 3    SAB TAB Ectopic Multiple Live Births   1 0 0 0 4      PMH:  Past Medical History:  Diagnosis Date  . No pertinent past medical history     PSH:  Past Surgical History:  Procedure Laterality Date  . CESAREAN SECTION N/A 04/09/2016   Procedure: CESAREAN SECTION;  Surgeon: Levie HeritageJacob J Stinson, DO;  [facility];  Service: Obstetrics;  Laterality: N/A;  . NO PAST SURGERIES     Meds: 17-OHP, PNV Allergies: NKDA FH: Negative Soc: No tobacco, illicit drug use or alcohol  Review of Systems: no vaginal bleeding or cramping/contractions, no LOF, no nausea/vomiting. All other systems reviewed and are negative.  PNL:All normal   PE: See EPIC section  Please see separate document for fetal ultrasound report.  A/P: History of PPROM/abruption and delivery, probably secondary to polyhydramnios There was minimal data available ion the [place] medical record and the parents were not very good historians despite adequate translation services. Fortunately, the [facility] proved to be a wealth of information. 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 The baby's death was multifactorial. The major problems of renal failure and prenatal polyhydramnios are NOT  usually associated with isovaleric acidemia. The renal insult was felt to posibly due to the abruption and worsened by the hypotension. This baby has a 25% chance of inheriting the defect. We will plan to follow her closely during the pregnancy, and she will follow up with us in 4 weeks. At that time genetic counseling will be done as well  Thank you for the opportunity to be a part of the care of Annette Lambert. Please contact our office if we can be of further assistance.   I spent approximately 30 minutes with this patient with over 50% of time spent in face-to-face counseling.

## 2016-12-26 ENCOUNTER — Ambulatory Visit (INDEPENDENT_AMBULATORY_CARE_PROVIDER_SITE_OTHER): Payer: 59

## 2016-12-26 VITALS — BP 109/69 | HR 79 | Wt 162.0 lb

## 2016-12-26 DIAGNOSIS — O09212 Supervision of pregnancy with history of pre-term labor, second trimester: Secondary | ICD-10-CM

## 2016-12-26 DIAGNOSIS — O09219 Supervision of pregnancy with history of pre-term labor, unspecified trimester: Secondary | ICD-10-CM

## 2016-12-26 MED ORDER — HYDROXYPROGESTERONE CAPROATE 250 MG/ML IM OIL
250.0000 mg | TOPICAL_OIL | Freq: Once | INTRAMUSCULAR | Status: AC
  Administered 2016-12-26: 250 mg via INTRAMUSCULAR

## 2016-12-26 NOTE — Progress Notes (Signed)
Patient presents for 17P Injection. Given in Right Arm (subcutaneous auto injector).Tolerated well. NDC #16109-604-54#64011-301-03. Administrations This Visit    hydroxyprogesterone caproate (MAKENA) 250 mg/mL injection 250 mg    Admin Date 12/26/2016 Action Given Dose 250 mg Route Intramuscular Administered By Maretta BeesMcGlashan, Carol J, RMA

## 2016-12-27 ENCOUNTER — Other Ambulatory Visit: Payer: Self-pay | Admitting: Obstetrics and Gynecology

## 2016-12-27 DIAGNOSIS — Z148 Genetic carrier of other disease: Secondary | ICD-10-CM

## 2017-01-03 ENCOUNTER — Ambulatory Visit: Payer: 59

## 2017-01-03 DIAGNOSIS — O09219 Supervision of pregnancy with history of pre-term labor, unspecified trimester: Secondary | ICD-10-CM

## 2017-01-03 MED ORDER — HYDROXYPROGESTERONE CAPROATE 250 MG/ML IM OIL: 250.0000 mg | TOPICAL_OIL | Freq: Once | INTRAMUSCULAR | Status: DC

## 2017-01-03 MED ORDER — HYDROXYPROGESTERONE CAPROATE 275 MG/1.1ML ~~LOC~~ SOAJ
275.0000 mg | Freq: Once | SUBCUTANEOUS | Status: AC
  Administered 2017-01-03: 275 mg via SUBCUTANEOUS

## 2017-01-03 NOTE — Progress Notes (Addendum)
Nurse visit for pt supplied subcutaneous 17p given in L arm w/o difficulty. [facility] shipped pt complimentary  supply of 17p. Pt given phone number to contact [facility] to arrange payment information for further subcutaneous supply. Pt agrees and voices understanding.

## 2017-01-09 ENCOUNTER — Ambulatory Visit (INDEPENDENT_AMBULATORY_CARE_PROVIDER_SITE_OTHER): Payer: 59

## 2017-01-09 DIAGNOSIS — O09219 Supervision of pregnancy with history of pre-term labor, unspecified trimester: Secondary | ICD-10-CM

## 2017-01-09 DIAGNOSIS — O09212 Supervision of pregnancy with history of pre-term labor, second trimester: Secondary | ICD-10-CM | POA: Diagnosis not present

## 2017-01-09 MED ORDER — HYDROXYPROGESTERONE CAPROATE 275 MG/1.1ML ~~LOC~~ SOAJ
275.0000 mg | Freq: Once | SUBCUTANEOUS | Status: AC
  Administered 2017-01-09: 275 mg via SUBCUTANEOUS

## 2017-01-09 NOTE — Progress Notes (Addendum)
[facility]. Last complimentary supply from [facility] given today. Pt said she tried calling them but could not reach anyone. She said she will call them again today. After pt left, I contacted [facility] to request another complimentary injection, [facility] said insurance will cover inj they just need to speak with the patient to approve shipment.

## 2017-01-14 ENCOUNTER — Encounter: Payer: 59 | Admitting: Obstetrics and Gynecology

## 2017-01-17 ENCOUNTER — Ambulatory Visit (INDEPENDENT_AMBULATORY_CARE_PROVIDER_SITE_OTHER): Payer: 59 | Admitting: Obstetrics and Gynecology

## 2017-01-17 VITALS — BP 109/72 | HR 105 | Wt 166.0 lb

## 2017-01-17 DIAGNOSIS — O09212 Supervision of pregnancy with history of pre-term labor, second trimester: Secondary | ICD-10-CM | POA: Diagnosis not present

## 2017-01-17 DIAGNOSIS — O0990 Supervision of high risk pregnancy, unspecified, unspecified trimester: Secondary | ICD-10-CM

## 2017-01-17 DIAGNOSIS — O09522 Supervision of elderly multigravida, second trimester: Secondary | ICD-10-CM

## 2017-01-17 DIAGNOSIS — Z148 Genetic carrier of other disease: Secondary | ICD-10-CM

## 2017-01-17 DIAGNOSIS — O09219 Supervision of pregnancy with history of pre-term labor, unspecified trimester: Secondary | ICD-10-CM

## 2017-01-17 DIAGNOSIS — Z98891 History of uterine scar from previous surgery: Secondary | ICD-10-CM

## 2017-01-17 DIAGNOSIS — O0992 Supervision of high risk pregnancy, unspecified, second trimester: Secondary | ICD-10-CM

## 2017-01-17 MED ORDER — HYDROXYPROGESTERONE CAPROATE 250 MG/ML IM OIL
250.0000 mg | TOPICAL_OIL | Freq: Once | INTRAMUSCULAR | Status: AC
  Administered 2017-01-17: 250 mg via INTRAMUSCULAR

## 2017-01-17 NOTE — Addendum Note (Signed)
Addended by: Hamilton CapriBURCH, Deny Chevez J on: 01/17/2017 12:57 PM   Modules accepted: Orders

## 2017-01-17 NOTE — Progress Notes (Signed)
   PRENATAL VISIT NOTE  Subjective:  Annette Lambert is a 36 y.o. [id number] at 3825w3d being seen today for ongoing prenatal care.  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  Patient reports increased abdominal girth and pressure over the past month .  Contractions: Not present. Vag. Bleeding: None.  Movement: Present. Denies leaking of fluid.   The following portions of the patient's history were reviewed and updated as appropriate: allergies, current medications, past family history, past medical history, past social history, past surgical history and problem list. Problem list updated.  Objective:   Vitals:   01/17/17 1117  BP: 109/72  Pulse: (!) 105  Weight: 166 lb (75.3 kg)    Fetal Status: Fetal Heart Rate (bpm): 145 Fundal Height: 35 cm Movement: Present     General:  Alert, oriented and cooperative. Patient is in no acute distress.  Skin: Skin is warm and dry. No rash noted.   Cardiovascular: Normal heart rate noted  Respiratory: Normal respiratory effort, no problems with respiration noted  Abdomen: Soft, gravid, appropriate for gestational age. Pain/Pressure: Present     Pelvic:  Cervical exam deferred        Extremities: Normal range of motion.  Edema: None  Mental Status: Normal mood and affect. Normal behavior. Normal judgment and thought content.   Assessment and Plan:  Pregnancy: A5W0981G6P3113 at 10525w3d  1. Supervision of high risk pregnancy, antepartum Patient is doing well Discussed fundal height and concern for polyhydramnios Patient has follow-up growth scan and genetic counseling appointment on 6/26. Discussed importance of keeping appoitnment Glucola and thrid trimester labs next visits  2.  Previous preterm delivery, antepartum Continue weekly 17-P  3. Elderly multigravida in second trimester Follow up genetic counseling  4. History of C-section Desires TOLAC  Preterm labor symptoms and general obstetric precautions including but not limited to vaginal bleeding, contractions, leaking of fluid and fetal movement were reviewed in detail with the patient. Please refer to After Visit Summary for other counseling recommendations.  Return in about 4 weeks (around 02/14/2017) for ROB, 2 hr glucola next visit.   Catalina AntiguaPeggy Shalay Carder, MD

## 2017-01-22 ENCOUNTER — Ambulatory Visit (HOSPITAL_COMMUNITY)
Admission: RE | Admit: 2017-01-22 | Discharge: 2017-01-22 | Disposition: A | Discharge status: Home / Self Care | Payer: 59 | Source: Ambulatory Visit | Attending: Certified Nurse Midwife | Admitting: Certified Nurse Midwife

## 2017-01-22 ENCOUNTER — Other Ambulatory Visit (HOSPITAL_COMMUNITY): Payer: Self-pay | Admitting: *Deleted

## 2017-01-22 ENCOUNTER — Encounter (HOSPITAL_COMMUNITY): Payer: Self-pay

## 2017-01-22 DIAGNOSIS — Z3A24 24 weeks gestation of pregnancy: Secondary | ICD-10-CM | POA: Diagnosis not present

## 2017-01-22 DIAGNOSIS — O402XX0 Polyhydramnios, second trimester, not applicable or unspecified: Secondary | ICD-10-CM | POA: Diagnosis not present

## 2017-01-22 DIAGNOSIS — O289 Unspecified abnormal findings on antenatal screening of mother: Secondary | ICD-10-CM | POA: Diagnosis not present

## 2017-01-22 DIAGNOSIS — O09299 Supervision of pregnancy with other poor reproductive or obstetric history, unspecified trimester: Secondary | ICD-10-CM | POA: Diagnosis not present

## 2017-01-22 DIAGNOSIS — O09522 Supervision of elderly multigravida, second trimester: Secondary | ICD-10-CM | POA: Insufficient documentation

## 2017-01-22 DIAGNOSIS — O352XX1 Maternal care for (suspected) hereditary disease in fetus, fetus 1: Secondary | ICD-10-CM

## 2017-01-22 DIAGNOSIS — O34219 Maternal care for unspecified type scar from previous cesarean delivery: Secondary | ICD-10-CM | POA: Insufficient documentation

## 2017-01-22 DIAGNOSIS — O409XX0 Polyhydramnios, unspecified trimester, not applicable or unspecified: Secondary | ICD-10-CM

## 2017-01-22 DIAGNOSIS — O09219 Supervision of pregnancy with history of pre-term labor, unspecified trimester: Secondary | ICD-10-CM | POA: Diagnosis not present

## 2017-01-22 NOTE — Progress Notes (Signed)
Genetic Counseling  Visit Summary Note  Appointment Date: 01/22/2017 Referred By: Morene Crocker, CNM  Date of Birth: 1981-04-12  Pregnancy history: [id number] Estimated Date of Delivery: 05/13/17 Estimated Gestational Age: [redacted]w[redacted]d I met with Ms. Annette Lambert her husband, Annette Lambert for genetic counseling because of family history concerns.  In summary:  Discussed family history of isovaleric acidemia (IVA)  Reviewed features and autosomal recessive inheritance of IVA, including 25% risk of recurrence  Discussed postnatal neonatal management recommendations   If prenatal dx is not made, neonate should have metabolic testing for IVA ([facility])  If neonate has IVA, should be transferred to a center with a pediatric metabolic unit ([facility], [facility])  Discussed options of screening / testing  Carrier screening for IVA versus universal carrier screening (given consanguinity)-universal screening performed  FOB declined IVA screening for today; likely will consider at next appointment  Discussed the option of amniocentesis for prenatal diagnosis of IVA  May consider if carrier screening for both parents is informative and shows a gene alteration  Reviewed AMA, risks, and normal NIPS results  Reviewed APA and associated risks  We began by reviewing the family history in detail. This couple granted me permission to review their deceased daughter's records. They were under the impression that their daughter died from renal failure due to kidney disease. This couple had a daughter in December of 2017, who was born preterm due to severe polyhydramnios. Shortly after birth, the child experienced metabolic acidosis and [facility] The child was evaluated by a medical geneticist at [facility] Dr. TDarrol Angel who requested metabolic testing and [id number] analysis. 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 Unfortunately, the parents were not at the bedside during Dr. JLedell Nossexamination and they failed to return the call of the pediatric genetic counselor. For these reasons, this couple was not previously counseled regarding their daughter's genetic condition. They were quite saddened to learn this information.  We reviewed IVA in detail. We discussed that IVA is a rare organic acid disorder, in which the body cannot process specific proteins properly. They were counseled that normally, the body breaks protein down into smaller units called amino acids, which are further processed to provide energy for growth and development. Individuals who have IVA are unable to make the enzyme that breaks down a specific amino acid (leucine), which results in organic acid (isovaleric acid) build up in the blood, urine, and tissues. This can cause serious health problems. Approximately 50% of individuals with IVA have a severe neonatal onset of symptoms, which can cause poor feeding, vomiting, seizures, coma, and in some cases, death. A characteristic sign of IVA is a distinct odor (sweaty feet) during acute illness. Medical records suggest that their daughter had this distinct odor.    We reviewed that IVA is inherited in an autosomal recessive manner.  We reviewed that we typically have 2 copies of every gene and that each gene has a specific function in the body. In recessive conditions, both copies of a pair of genes must have a change in order for the individual to show symptoms of the condition. In these cases, the parents of an affected individual usually have one working copy and one changed copy of the gene (also called a carrier). Carriers of genetic conditions typically do not have signs and symptoms of the condition. They were counseled that parents who are  carriers of the same recessive condition have a 1 in 4 chance to both pass on the changed gene and has a child with the  condition; a 1 in 4 chance to both pass on the working copies of the gene and have a child who is neither a carrier nor affected with the condition; and 1 in 2 chance that one parent would pass on the changed copy of the gene and the other parent would pass on the working copy such that the child would be a carrier of the condition, like the parents.  They were very sad to learn that there is an increased chance for the fetus to have IVA. This couple reported that they are first cousins (their mothers are sisters). We reviewed consanguinity and the associated increase in risks for autosomal recessive conditions.   We discussed that treatment of IVA typically includes a protein restricted diet.  We discussed that individuals who have IVA, who adhere to a strict diet, typically do well and have less episodes of metabolic crisis, although illness, fasting, and eating protein-rich foods can result in episodes of crisis. We discussed that both [facility] and [facility] have excellent pediatric metabolic clinics and that if this baby is born with IVA, transfer of care to a metabolic clinic is warranted.   We then discussed available prenatal testing options. They were counseled regarding the availability of genetic carrier testing. Isovaleric acidemia is caused by alterations in IVD and sequence analysis of this gene results in an approximate 99% detection of carriers. We reviewed that it is very likely that this testing will reveal that both the patient and her partner are carriers of IVA, given their daughter's metabolic test results. After thoughtful consideration of their options, Ms. Annette Lambert elected to have carrier screening for IVA. Given that this couple reported that they are cousins, I offered them the option of expanded (universal) carrier screening through [place]. They understand that each individual is a carrier of approximately 7-10 genes and that this testing may reveal that Ms. Annette Lambert is a carrier of not  only IVA, but other conditions. We reviewed the technology, accuracy, benefits, limitations, and cost. After much thought and discussion, she elected to have universal carrier screening. Annette Lambert, was also offered the option of both IVA carrier testing and universal carrier screening today. He declined testing today and wishes to pursue testing if his wife is found to be a carrier of any conditions. He understands that it is very likely that she will be found to be a carrier of IVA.    We then discussed the option of amniocentesis for prenatal diagnosis of IVA. They understand that given the advanced gestational age, amniocentesis is associated with an increased risk of preterm delivery. Given the associated finding of polyhydramnios, an amnioreduction may be indicated in the future. We reviewed the benefits, limitations, and risks of amniocentesis. This couple may wish to pursue prenatal testing for IVA in the future. We discussed that if the baby is found to have IVA or is suspected to have IVA (in the event that prenatal diagnosis is not performed), the infant should be placed on a protein restricted diet and testing for biochemical/metabolic testing for IVA should be sent to [facility]. The parents are aware of these recommendations.   Of note, this couple's prior pregnancy had severe polyhydramnios and the current pregnancy has severe polyhydramnios. I could find an association between IVA and the prenatal finding of polyhydramnios, although this association cannot be ruled out.  We discussed that IVA usually presents postnatally after feeding begins and not during fetal life.   The family histories were otherwise found to be noncontributory for birth defects, intellectual disability, and known genetic conditions. Without further information regarding the provided family history, an accurate genetic risk cannot be calculated. Further genetic counseling is warranted if more information is obtained.  They were  also briefly counseled regarding maternal age and the association with risk for chromosome conditions due to nondisjunction with aging of the ova.  We reviewed chromosomes, nondisjunction, and the associated 1 in 100 risk for fetal aneuploidy related to a maternal age of 87 at [redacted] weeks gestation. They were counseled that the risk for aneuploidy decreases as gestational age increases, accounting for those pregnancies which spontaneously abort.  We specifically discussed Down syndrome (trisomy 74), trisomies 40 and 31, and sex chromosome aneuploidies (47,XXX and 47,XXY) including the common features and prognoses of each.   We reviewed Ms. Annette Lambert's noninvasive prenatal screening (NIPS)/cell free DNA (cfDNA) testing result. Specifically, she had [employer] testing performed at her obstetrician's office. We reviewed that the results from this testing are negative.  We reviewed that NIPS analyzes specific placental (fetal) DNA in maternal circulation. NIPS is considered to be highly specific and sensitive, but is not considered to be a diagnostic test. We reviewed that this testing identifies the majority of pregnancies with trisomies 80, 33, and 19, as well as specific sex chromosome conditions including: 47,XXX and 47,XXY, in addition to select microdeletions and large (7 Mb) deletions and duplications of fetal chromosome material.  We again reviewed the option of amniocentesis; this couple further fetal chromosome analysis via amniocentesis.  The FOB reported that he is 36 years old. They were counseled that advanced paternal age (APA) is defined as paternal age greater than or equal to age 66.  Recent large-scale sequencing studies have shown that approximately 80% of de novo point mutations are of paternal origin.  Many studies have demonstrated a strong correlation between increased paternal age and de novo point mutations.  Although no specific data is available regarding fetal risks for fathers 26+ years  old at conception, it is apparent that the overall risk for single gene conditions is increased.  To estimate the relative increase in risk of a genetic disorder with APA, the heritability of the disease must be considered.  Assuming an approximate 2x increase in risk for conditions that are exclusively paternal in origin, the risk for each individual condition is still relatively low.  It is estimated that the overall chance for a de novo mutation is ~0.5%.  We also discussed the wide range of conditions which can be caused by new dominant gene mutations (achondroplasia, neurofibromatosis, Marfan syndrome etc.).  They were counseled that genetic testing for each individual single gene condition is not warranted or available unless ultrasound or family history concerns lend suspicion to a specific condition.  However, there is another NIPS platform ([facility] through [place]) that is able to assess for specific mutations in a panel of 30 selected genes covering 26 conditions.  Most of these conditions follow an autosomal dominant pattern of inheritance and typically occur due to de novo gene mutations.  The detection rates for these conditions vary depending upon the specific condition but range from 43% to 96%.  Therefore, this screening would not identify all new dominant gene mutations.  They declined additional screening with [facility].   In addition, they were counseled that newer literature suggests that the risk for  autism spectrum disorders (ASD) may be increased in children born to fathers of APA.  We discussed that ASDs are among the most common neurodevelopmental disorders, with approximately 1 in 85 children meeting criteria for ASD.  Approximately 80% of individuals diagnosed are female.  There is strong evidence that genetic factors play a critical role in development of ASD.  While there have been recent advances in identifying specific genetic causes of ASD, there are still many individuals for whom the  etiology of the ASD is not known. They understand that at this time there is no reliable, comprehensive genetic testing available for ASD.  Ms. Annette Lambert denied exposure to environmental toxins or chemical agents. She denied the use of alcohol, tobacco or street drugs. She denied significant viral illnesses during the course of her pregnancy.   I counseled this couple regarding the above risks and available options.  The approximate face-to-face time with the genetic counselor was 52 minutes.  Filbert Schilder, MS Certified Genetic Counselor

## 2017-01-23 ENCOUNTER — Ambulatory Visit (INDEPENDENT_AMBULATORY_CARE_PROVIDER_SITE_OTHER): Payer: 59

## 2017-01-23 DIAGNOSIS — O09219 Supervision of pregnancy with history of pre-term labor, unspecified trimester: Secondary | ICD-10-CM

## 2017-01-23 DIAGNOSIS — O09212 Supervision of pregnancy with history of pre-term labor, second trimester: Secondary | ICD-10-CM

## 2017-01-23 MED ORDER — HYDROXYPROGESTERONE CAPROATE 250 MG/ML IM OIL
250.0000 mg | TOPICAL_OIL | INTRAMUSCULAR | Status: AC
  Administered 2017-01-23 – 2017-04-10 (×8): 250 mg via INTRAMUSCULAR

## 2017-01-23 NOTE — Progress Notes (Signed)
Patient is in the office for 17p injection, administered and pt tolerated well .Marland Kitchen. Administrations This Visit    hydroxyprogesterone caproate (MAKENA) 250 mg/mL injection 250 mg    Admin Date 01/23/2017 Action Given Dose 250 mg Route Intramuscular Administered By Katrina StackStalling, Tarin Johndrow D, RN

## 2017-01-28 DIAGNOSIS — Z3A24 24 weeks gestation of pregnancy: Secondary | ICD-10-CM | POA: Insufficient documentation

## 2017-01-28 DIAGNOSIS — O352XX1 Maternal care for (suspected) hereditary disease in fetus, fetus 1: Secondary | ICD-10-CM | POA: Insufficient documentation

## 2017-01-31 ENCOUNTER — Ambulatory Visit (INDEPENDENT_AMBULATORY_CARE_PROVIDER_SITE_OTHER): Payer: 59

## 2017-01-31 VITALS — BP 103/62 | HR 101 | Wt 169.0 lb

## 2017-01-31 DIAGNOSIS — O09212 Supervision of pregnancy with history of pre-term labor, second trimester: Secondary | ICD-10-CM

## 2017-01-31 DIAGNOSIS — O09219 Supervision of pregnancy with history of pre-term labor, unspecified trimester: Secondary | ICD-10-CM

## 2017-01-31 NOTE — Progress Notes (Signed)
Patient for 17P injection. Given in [facility]. Tolerated well.  Administrations This Visit    hydroxyprogesterone caproate (MAKENA) 250 mg/mL injection 250 mg    Admin Date 01/31/2017 Action Given Dose 250 mg Route Intramuscular Administered By Maretta BeesMcGlashan, Sriyan Cutting J, RMA

## 2017-02-05 ENCOUNTER — Ambulatory Visit (HOSPITAL_COMMUNITY)
Admission: RE | Admit: 2017-02-05 | Discharge: 2017-02-05 | Disposition: A | Discharge status: Home / Self Care | Payer: 59 | Source: Ambulatory Visit | Attending: Certified Nurse Midwife | Admitting: Certified Nurse Midwife

## 2017-02-05 ENCOUNTER — Other Ambulatory Visit (HOSPITAL_COMMUNITY): Payer: Self-pay | Admitting: Obstetrics and Gynecology

## 2017-02-05 ENCOUNTER — Encounter (HOSPITAL_COMMUNITY): Payer: Self-pay

## 2017-02-05 DIAGNOSIS — O365930 Maternal care for other known or suspected poor fetal growth, third trimester, not applicable or unspecified: Secondary | ICD-10-CM

## 2017-02-05 DIAGNOSIS — Z3A26 26 weeks gestation of pregnancy: Secondary | ICD-10-CM | POA: Diagnosis not present

## 2017-02-05 DIAGNOSIS — O09522 Supervision of elderly multigravida, second trimester: Secondary | ICD-10-CM | POA: Insufficient documentation

## 2017-02-05 DIAGNOSIS — O09299 Supervision of pregnancy with other poor reproductive or obstetric history, unspecified trimester: Secondary | ICD-10-CM

## 2017-02-05 DIAGNOSIS — O409XX0 Polyhydramnios, unspecified trimester, not applicable or unspecified: Secondary | ICD-10-CM

## 2017-02-05 DIAGNOSIS — O09219 Supervision of pregnancy with history of pre-term labor, unspecified trimester: Secondary | ICD-10-CM

## 2017-02-05 DIAGNOSIS — O402XX1 Polyhydramnios, second trimester, fetus 1: Secondary | ICD-10-CM | POA: Diagnosis present

## 2017-02-05 DIAGNOSIS — O09292 Supervision of pregnancy with other poor reproductive or obstetric history, second trimester: Secondary | ICD-10-CM

## 2017-02-05 NOTE — Progress Notes (Signed)
Met with patient to disclose results of pan-ethnic carrier screening panel through [facility] laboratory through telephonic [place] interpreter. 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 The results are negative for all of the remaining conditions for which analysis was performed.  This indicates that she does not have a detectable gene alteration in any of the genes for which analysis was performed, with the exception of IVD and BTD. 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  We discussed that carrier status for each of these conditions is not expected to increase the risk for medical effects in the patient. Both conditions follow autosomal recessive inheritance, and we reviewed the option of carrier screening for her partner. Mr. Deeann Dowse elected to proceed with carrier screening for Isovaleric acidemia and biotinidase deficiency through [facility] laboratory today. They understand that prenatal diagnosis via amniocentesis would be available if carrier status is also identified for Mr. Deeann Dowse for either/both of these conditions.   Carrier screening does not detect all carriers of all of these conditions, but a normal result significantly decreases the likelihood of being a carrier, and therefore, the overall reproductive risk. Counsyl sequences most of the genes, which is associated with a high detection rate for carriers, thus a negative screen is very reassuring. All questions were answered to her satisfaction, she was encouraged to call with additional questions or concerns. ? Chipper Oman, MS Insurance risk surveyor

## 2017-02-06 ENCOUNTER — Other Ambulatory Visit (HOSPITAL_COMMUNITY): Payer: Self-pay | Admitting: *Deleted

## 2017-02-06 DIAGNOSIS — O409XX0 Polyhydramnios, unspecified trimester, not applicable or unspecified: Secondary | ICD-10-CM

## 2017-02-07 ENCOUNTER — Ambulatory Visit: Payer: 59 | Admitting: *Deleted

## 2017-02-07 ENCOUNTER — Ambulatory Visit (INDEPENDENT_AMBULATORY_CARE_PROVIDER_SITE_OTHER): Payer: 59 | Admitting: *Deleted

## 2017-02-07 DIAGNOSIS — O09212 Supervision of pregnancy with history of pre-term labor, second trimester: Secondary | ICD-10-CM

## 2017-02-07 DIAGNOSIS — O09219 Supervision of pregnancy with history of pre-term labor, unspecified trimester: Secondary | ICD-10-CM

## 2017-02-07 NOTE — Progress Notes (Signed)
Patient is in the office today for her weekly Makena injection.

## 2017-02-08 ENCOUNTER — Other Ambulatory Visit (HOSPITAL_COMMUNITY): Payer: Self-pay | Admitting: *Deleted

## 2017-02-08 DIAGNOSIS — O409XX0 Polyhydramnios, unspecified trimester, not applicable or unspecified: Secondary | ICD-10-CM

## 2017-02-13 ENCOUNTER — Encounter (HOSPITAL_COMMUNITY): Payer: Self-pay

## 2017-02-13 ENCOUNTER — Ambulatory Visit (HOSPITAL_COMMUNITY)
Admission: RE | Admit: 2017-02-13 | Discharge: 2017-02-13 | Disposition: A | Discharge status: Home / Self Care | Payer: 59 | Source: Ambulatory Visit | Attending: Certified Nurse Midwife | Admitting: Certified Nurse Midwife

## 2017-02-13 ENCOUNTER — Other Ambulatory Visit (HOSPITAL_COMMUNITY): Payer: Self-pay | Admitting: Maternal and Fetal Medicine

## 2017-02-13 DIAGNOSIS — O34219 Maternal care for unspecified type scar from previous cesarean delivery: Secondary | ICD-10-CM

## 2017-02-13 DIAGNOSIS — O402XX0 Polyhydramnios, second trimester, not applicable or unspecified: Secondary | ICD-10-CM | POA: Insufficient documentation

## 2017-02-13 DIAGNOSIS — Z3A27 27 weeks gestation of pregnancy: Secondary | ICD-10-CM

## 2017-02-13 DIAGNOSIS — O09292 Supervision of pregnancy with other poor reproductive or obstetric history, second trimester: Secondary | ICD-10-CM | POA: Insufficient documentation

## 2017-02-13 DIAGNOSIS — O34211 Maternal care for low transverse scar from previous cesarean delivery: Secondary | ICD-10-CM | POA: Insufficient documentation

## 2017-02-13 DIAGNOSIS — O09522 Supervision of elderly multigravida, second trimester: Secondary | ICD-10-CM

## 2017-02-13 DIAGNOSIS — O09299 Supervision of pregnancy with other poor reproductive or obstetric history, unspecified trimester: Secondary | ICD-10-CM

## 2017-02-13 DIAGNOSIS — O09212 Supervision of pregnancy with history of pre-term labor, second trimester: Secondary | ICD-10-CM | POA: Insufficient documentation

## 2017-02-13 DIAGNOSIS — O409XX0 Polyhydramnios, unspecified trimester, not applicable or unspecified: Secondary | ICD-10-CM

## 2017-02-13 DIAGNOSIS — Z8759 Personal history of other complications of pregnancy, childbirth and the puerperium: Secondary | ICD-10-CM

## 2017-02-14 ENCOUNTER — Other Ambulatory Visit (HOSPITAL_COMMUNITY): Payer: Self-pay

## 2017-02-14 ENCOUNTER — Ambulatory Visit (INDEPENDENT_AMBULATORY_CARE_PROVIDER_SITE_OTHER): Payer: 59 | Admitting: Obstetrics & Gynecology

## 2017-02-14 ENCOUNTER — Other Ambulatory Visit: Payer: 59

## 2017-02-14 VITALS — BP 98/64 | HR 99 | Wt 174.0 lb

## 2017-02-14 DIAGNOSIS — Z23 Encounter for immunization: Secondary | ICD-10-CM

## 2017-02-14 DIAGNOSIS — O09219 Supervision of pregnancy with history of pre-term labor, unspecified trimester: Secondary | ICD-10-CM

## 2017-02-14 DIAGNOSIS — O352XX1 Maternal care for (suspected) hereditary disease in fetus, fetus 1: Secondary | ICD-10-CM

## 2017-02-14 DIAGNOSIS — O24419 Gestational diabetes mellitus in pregnancy, unspecified control: Secondary | ICD-10-CM

## 2017-02-14 DIAGNOSIS — O0990 Supervision of high risk pregnancy, unspecified, unspecified trimester: Secondary | ICD-10-CM

## 2017-02-14 DIAGNOSIS — O09212 Supervision of pregnancy with history of pre-term labor, second trimester: Secondary | ICD-10-CM | POA: Diagnosis not present

## 2017-02-14 DIAGNOSIS — O403XX0 Polyhydramnios, third trimester, not applicable or unspecified: Secondary | ICD-10-CM | POA: Insufficient documentation

## 2017-02-14 DIAGNOSIS — O0993 Supervision of high risk pregnancy, unspecified, third trimester: Secondary | ICD-10-CM

## 2017-02-14 DIAGNOSIS — O09213 Supervision of pregnancy with history of pre-term labor, third trimester: Secondary | ICD-10-CM

## 2017-02-14 DIAGNOSIS — O409XX0 Polyhydramnios, unspecified trimester, not applicable or unspecified: Secondary | ICD-10-CM

## 2017-02-14 NOTE — Patient Instructions (Signed)
Return to clinic for any scheduled appointments or obstetric concerns, or go to [facility] for evaluation   Third Trimester of Pregnancy The third trimester is from week 28 through week 40 (months 7 through 9). The third trimester is a time when the unborn baby (fetus) is growing rapidly. At the end of the ninth month, the fetus is about 20 inches in length and weighs 6-10 pounds. Body changes during your third trimester Your body will continue to go through many changes during pregnancy. The changes vary from woman to woman. During the third trimester:  Your weight will continue to increase. You can expect to gain 25-35 pounds (11-16 kg) by the end of the pregnancy.  You may begin to get stretch marks on your hips, abdomen, and breasts.  You may urinate more often because the fetus is moving lower into your pelvis and pressing on your bladder.  You may develop or continue to have heartburn. This is caused by increased hormones that slow down muscles in the digestive tract.  You may develop or continue to have constipation because increased hormones slow digestion and cause the muscles that push waste through your intestines to relax.  You may develop hemorrhoids. These are swollen veins (varicose veins) in the rectum that can itch or be painful.  You may develop swollen, bulging veins (varicose veins) in your legs.  You may have increased body aches in the pelvis, back, or thighs. This is due to weight gain and increased hormones that are relaxing your joints.  You may have changes in your hair. These can include thickening of your hair, rapid growth, and changes in texture. Some women also have hair loss during or after pregnancy, or hair that feels dry or thin. Your hair will most likely return to normal after your baby is born.  Your breasts will continue to grow and they will continue to become tender. A yellow fluid (colostrum) may leak from your breasts. This is the first milk you are  producing for your baby.  Your belly button may stick out.  You may notice more swelling in your hands, face, or ankles.  You may have increased tingling or numbness in your hands, arms, and legs. The skin on your belly may also feel numb.  You may feel short of breath because of your expanding uterus.  You may have more problems sleeping. This can be caused by the size of your belly, increased need to urinate, and an increase in your body's metabolism.  You may notice the fetus "dropping," or moving lower in your abdomen (lightening).  You may have increased vaginal discharge.  You may notice your joints feel loose and you may have pain around your pelvic bone.  What to expect at prenatal visits You will have prenatal exams every 2 weeks until week 36. Then you will have weekly prenatal exams. During a routine prenatal visit:  You will be weighed to make sure you and the baby are growing normally.  Your blood pressure will be taken.  Your abdomen will be measured to track your baby's growth.  The fetal heartbeat will be listened to.  Any test results from the previous visit will be discussed.  You may have a cervical check near your due date to see if your cervix has softened or thinned (effaced).  You will be tested for Group B streptococcus. This happens between 35 and 37 weeks.  Your health care provider may ask you:  What your birth plan is.    How you are feeling.  If you are feeling the baby move.  If you have had any abnormal symptoms, such as leaking fluid, bleeding, severe headaches, or abdominal cramping.  If you are using any tobacco products, including cigarettes, chewing tobacco, and electronic cigarettes.  If you have any questions.  Other tests or screenings that may be performed during your third trimester include:  Blood tests that check for low iron levels (anemia).  Fetal testing to check the health, activity level, and growth of the fetus.  Testing is done if you have certain medical conditions or if there are problems during the pregnancy.  Nonstress test (NST). This test checks the health of your baby to make sure there are no signs of problems, such as the baby not getting enough oxygen. During this test, a belt is placed around your belly. The baby is made to move, and its heart rate is monitored during movement.  What is false labor? False labor is a condition in which you feel small, irregular tightenings of the muscles in the womb (contractions) that usually go away with rest, changing position, or drinking water. These are called Braxton Hicks contractions. Contractions may last for hours, days, or even weeks before true labor sets in. If contractions come at regular intervals, become more frequent, increase in intensity, or become painful, you should see your health care provider. What are the signs of labor?  Abdominal cramps.  Regular contractions that start at 10 minutes apart and become stronger and more frequent with time.  Contractions that start on the top of the uterus and spread down to the lower abdomen and back.  Increased pelvic pressure and dull back pain.  A watery or bloody mucus discharge that comes from the vagina.  Leaking of amniotic fluid. This is also known as your "water breaking." It could be a slow trickle or a gush. Let your health care provider know if it has a color or strange odor. If you have any of these signs, call your health care provider right away, even if it is before your due date. Follow these instructions at home: Medicines  Follow your health care provider's instructions regarding medicine use. Specific medicines may be either safe or unsafe to take during pregnancy.  Take a prenatal vitamin that contains at least 600 micrograms (mcg) of folic acid.  If you develop constipation, try taking a stool softener if your health care provider approves. Eating and drinking  Eat a  balanced diet that includes fresh fruits and vegetables, whole grains, good sources of protein such as meat, eggs, or tofu, and low-fat dairy. Your health care provider will help you determine the amount of weight gain that is right for you.  Avoid raw meat and uncooked cheese. These carry germs that can cause birth defects in the baby.  If you have low calcium intake from food, talk to your health care provider about whether you should take a daily calcium supplement.  Eat four or five small meals rather than three large meals a day.  Limit foods that are high in fat and processed sugars, such as fried and sweet foods.  To prevent constipation: ? Drink enough fluid to keep your urine clear or pale yellow. ? Eat foods that are high in fiber, such as fresh fruits and vegetables, whole grains, and beans. Activity  Exercise only as directed by your health care provider. Most women can continue their usual exercise routine during pregnancy. Try to exercise for 30   minutes at least 5 days a week. Stop exercising if you experience uterine contractions.  Avoid heavy lifting.  Do not exercise in extreme heat or humidity, or at high altitudes.  Wear low-heel, comfortable shoes.  Practice good posture.  You may continue to have sex unless your health care provider tells you otherwise. Relieving pain and discomfort  Take frequent breaks and rest with your legs elevated if you have leg cramps or low back pain.  Take warm sitz baths to soothe any pain or discomfort caused by hemorrhoids. Use hemorrhoid cream if your health care provider approves.  Wear a good support bra to prevent discomfort from breast tenderness.  If you develop varicose veins: ? Wear support pantyhose or compression stockings as told by your healthcare provider. ? Elevate your feet for 15 minutes, 3-4 times a day. Prenatal care  Write down your questions. Take them to your prenatal visits.  Keep all your prenatal  visits as told by your health care provider. This is important. Safety  Wear your seat belt at all times when driving.  Make a list of emergency phone numbers, including numbers for family, friends, the hospital, and police and fire departments. General instructions  Avoid cat litter boxes and soil used by cats. These carry germs that can cause birth defects in the baby. If you have a cat, ask someone to clean the litter box for you.  Do not travel far distances unless it is absolutely necessary and only with the approval of your health care provider.  Do not use hot tubs, steam rooms, or saunas.  Do not drink alcohol.  Do not use any products that contain nicotine or tobacco, such as cigarettes and e-cigarettes. If you need help quitting, ask your health care provider.  Do not use any medicinal herbs or unprescribed drugs. These chemicals affect the formation and growth of the baby.  Do not douche or use tampons or scented sanitary pads.  Do not cross your legs for long periods of time.  To prepare for the arrival of your baby: ? Take prenatal classes to understand, practice, and ask questions about labor and delivery. ? Make a trial run to the hospital. ? Visit the hospital and tour the maternity area. ? Arrange for maternity or paternity leave through employers. ? Arrange for family and friends to take care of pets while you are in the hospital. ? Purchase a rear-facing car seat and make sure you know how to install it in your car. ? Pack your hospital bag. ? Prepare the baby's nursery. Make sure to remove all pillows and stuffed animals from the baby's crib to prevent suffocation.  Visit your dentist if you have not gone during your pregnancy. Use a soft toothbrush to brush your teeth and be gentle when you floss. Contact a health care provider if:  You are unsure if you are in labor or if your water has broken.  You become dizzy.  You have mild pelvic cramps, pelvic  pressure, or nagging pain in your abdominal area.  You have lower back pain.  You have persistent nausea, vomiting, or diarrhea.  You have an unusual or bad smelling vaginal discharge.  You have pain when you urinate. Get help right away if:  Your water breaks before 37 weeks.  You have regular contractions less than 5 minutes apart before 37 weeks.  You have a fever.  You are leaking fluid from your vagina.  You have spotting or bleeding from your vagina.    You have severe abdominal pain or cramping.  You have rapid weight loss or weight gain.  You have shortness of breath with chest pain.  You notice sudden or extreme swelling of your face, hands, ankles, feet, or legs.  Your baby makes fewer than 10 movements in 2 hours.  You have severe headaches that do not go away when you take medicine.  You have vision changes. Summary  The third trimester is from week 28 through week 40, months 7 through 9. The third trimester is a time when the unborn baby (fetus) is growing rapidly.  During the third trimester, your discomfort may increase as you and your baby continue to gain weight. You may have abdominal, leg, and back pain, sleeping problems, and an increased need to urinate.  During the third trimester your breasts will keep growing and they will continue to become tender. A yellow fluid (colostrum) may leak from your breasts. This is the first milk you are producing for your baby.  False labor is a condition in which you feel small, irregular tightenings of the muscles in the womb (contractions) that eventually go away. These are called Braxton Hicks contractions. Contractions may last for hours, days, or even weeks before true labor sets in.  Signs of labor can include: abdominal cramps; regular contractions that start at 10 minutes apart and become stronger and more frequent with time; watery or bloody mucus discharge that comes from the vagina; increased pelvic pressure  and dull back pain; and leaking of amniotic fluid. This information is not intended to replace advice given to you by your health care provider. Make sure you discuss any questions you have with your health care provider. Document Released: 07/10/2001 Document Revised: 12/22/2015 Document Reviewed: 09/16/2012 Elsevier Interactive Patient Education  2017 [employer]  

## 2017-02-14 NOTE — Progress Notes (Signed)
Pt was given 17p injection at today's visit.  Pt tolerated well.  Administrations This Visit    hydroxyprogesterone caproate (MAKENA) 250 mg/mL injection 250 mg    Admin Date 02/14/2017 Action Given Dose 250 mg Route Intramuscular Administered By Lanney GinsFoster, Suzanne D, CMA

## 2017-02-14 NOTE — Progress Notes (Signed)
   PRENATAL VISIT NOTE  Subjective:  Annette Lambert is a 36 y.o. [id number] at 7030w3d being seen today for ongoing prenatal care.  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  Patient reports no complaints. Accompanied by her husband Contractions: Not present. Vag. Bleeding: None.  Movement: Present. Denies leaking of fluid.   The following portions of the patient's history were reviewed and updated as appropriate: allergies, current medications, past family history, past medical history, past social history, past surgical history and problem list. Problem list updated.  Objective:   Vitals:   02/14/17 0937  BP: 98/64  Pulse: 99  Weight: 174 lb (78.9 kg)    Fetal Status: Fetal Heart Rate (bpm): 150 Fundal Height: 40 cm Movement: Present     General:  Alert, oriented and cooperative. Patient is in no acute distress.  Skin: Skin is warm and dry. No rash noted.   Cardiovascular: Normal heart rate noted  Respiratory: Normal respiratory effort, no problems with respiration noted  Abdomen: Soft, gravid, appropriate for gestational age.  Pain/Pressure: Absent     Pelvic: Cervical exam deferred        Extremities: Normal range of motion.  Edema: None  Mental Status:  Normal mood and affect. Normal behavior. Normal judgment and thought content.   Assessment and Plan:  Pregnancy: [id number] at 4130w3d  1. Previous preterm delivery, antepartum Continue weekly 17P.  Third trimester labs and Tdap today. - Glucose Tolerance, 2 Hours w/1 Hour - CBC - HIV antibody - RPR - Tdap vaccine greater than or equal to 7yo IM  2.  Hereditary disease in family possibly affecting fetus, fetus 1 3. Polyhydramnios affecting pregnancy Previous baby born at 8232 weeks with polyhydramnios, abruption and PPROM; [facility] died [place] [facility] at 919 days of [place] with Isovaleric acidemia. This pregnancy remarkable for significant polyhydramnios, reduction was recommended but she declined.  Will start weekly BPP for antentatal testing next week, called MFM to add this on.  Will continue MFM surveillance; [facility] - US MFM FETAL BPP WO NON STRESS; Future - US MFM FETAL BPP WO NON STRESS; Future  4. Supervision of high risk pregnancy, antepartum Preterm labor symptoms and general obstetric precautions including but not limited to vaginal bleeding, contractions, leaking of fluid and fetal movement were reviewed in detail with the patient. Please refer to After Visit Summary for other counseling recommendations.  Return in about 1 week (around 02/21/2017) for 17P only. 2 weeks 17P and OB visit.   Jaynie CollinsUgonna Rayven Hendrickson, MD

## 2017-02-15 ENCOUNTER — Encounter: Payer: Self-pay | Admitting: Obstetrics & Gynecology

## 2017-02-15 DIAGNOSIS — O24419 Gestational diabetes mellitus in pregnancy, unspecified control: Secondary | ICD-10-CM | POA: Insufficient documentation

## 2017-02-15 LAB — CBC
HEMOGLOBIN: 10.8 g/dL — AB (ref 11.1–15.9)
Hematocrit: 33.3 % — ABNORMAL LOW (ref 34.0–46.6)
MCH: 28.1 pg (ref 26.6–33.0)
MCHC: 32.4 g/dL (ref 31.5–35.7)
MCV: 87 fL (ref 79–97)
Platelets: 242 10*3/uL (ref 150–379)
RBC: 3.84 x10E6/uL (ref 3.77–5.28)
RDW: 14.4 % (ref 12.3–15.4)
WBC: 7.8 10*3/uL (ref 3.4–10.8)

## 2017-02-15 LAB — HIV ANTIBODY (ROUTINE TESTING W REFLEX): HIV Screen 4th Generation wRfx: NONREACTIVE

## 2017-02-15 LAB — GLUCOSE TOLERANCE, 2 HOURS W/ 1HR
GLUCOSE, 2 HOUR: 100 mg/dL (ref 65–152)
Glucose, 1 hour: 132 mg/dL (ref 65–179)
Glucose, Fasting: 94 mg/dL — ABNORMAL HIGH (ref 65–91)

## 2017-02-15 LAB — RPR: RPR: NONREACTIVE

## 2017-02-15 NOTE — Addendum Note (Signed)
Addended by: Jaynie CollinsANYANWU, Yeudiel Mateo A on: 02/15/2017 10:37 PM   Modules accepted: Orders

## 2017-02-19 ENCOUNTER — Ambulatory Visit (HOSPITAL_COMMUNITY)
Admission: RE | Admit: 2017-02-19 | Discharge: 2017-02-19 | Disposition: A | Discharge status: Home / Self Care | Payer: 59 | Source: Ambulatory Visit | Attending: Certified Nurse Midwife | Admitting: Certified Nurse Midwife

## 2017-02-19 ENCOUNTER — Encounter (HOSPITAL_COMMUNITY): Payer: Self-pay

## 2017-02-19 DIAGNOSIS — O09523 Supervision of elderly multigravida, third trimester: Secondary | ICD-10-CM | POA: Diagnosis not present

## 2017-02-19 DIAGNOSIS — Z3A28 28 weeks gestation of pregnancy: Secondary | ICD-10-CM | POA: Diagnosis not present

## 2017-02-19 DIAGNOSIS — O34211 Maternal care for low transverse scar from previous cesarean delivery: Secondary | ICD-10-CM | POA: Insufficient documentation

## 2017-02-19 DIAGNOSIS — O24419 Gestational diabetes mellitus in pregnancy, unspecified control: Secondary | ICD-10-CM

## 2017-02-19 DIAGNOSIS — O09293 Supervision of pregnancy with other poor reproductive or obstetric history, third trimester: Secondary | ICD-10-CM | POA: Diagnosis not present

## 2017-02-19 DIAGNOSIS — O402XX0 Polyhydramnios, second trimester, not applicable or unspecified: Secondary | ICD-10-CM | POA: Diagnosis not present

## 2017-02-19 DIAGNOSIS — O09213 Supervision of pregnancy with history of pre-term labor, third trimester: Secondary | ICD-10-CM | POA: Diagnosis not present

## 2017-02-19 DIAGNOSIS — O409XX0 Polyhydramnios, unspecified trimester, not applicable or unspecified: Secondary | ICD-10-CM

## 2017-02-19 DIAGNOSIS — O352XX1 Maternal care for (suspected) hereditary disease in fetus, fetus 1: Secondary | ICD-10-CM

## 2017-02-20 ENCOUNTER — Other Ambulatory Visit (HOSPITAL_COMMUNITY): Payer: Self-pay | Admitting: *Deleted

## 2017-02-20 DIAGNOSIS — O4193X0 Disorder of amniotic fluid and membranes, unspecified, third trimester, not applicable or unspecified: Secondary | ICD-10-CM

## 2017-02-22 ENCOUNTER — Ambulatory Visit (HOSPITAL_COMMUNITY)
Admission: RE | Admit: 2017-02-22 | Discharge: 2017-02-22 | Disposition: A | Discharge status: Home / Self Care | Payer: 59 | Source: Ambulatory Visit | Attending: Obstetrics | Admitting: Obstetrics

## 2017-02-22 ENCOUNTER — Ambulatory Visit (HOSPITAL_COMMUNITY)
Admission: RE | Admit: 2017-02-22 | Discharge: 2017-02-22 | Disposition: A | Discharge status: Home / Self Care | Payer: 59 | Source: Ambulatory Visit | Attending: Maternal and Fetal Medicine | Admitting: Maternal and Fetal Medicine

## 2017-02-22 ENCOUNTER — Ambulatory Visit: Payer: 59

## 2017-02-22 ENCOUNTER — Ambulatory Visit (HOSPITAL_COMMUNITY): Admission: RE | Admit: 2017-02-22 | Payer: 59 | Source: Ambulatory Visit

## 2017-02-22 ENCOUNTER — Observation Stay (HOSPITAL_COMMUNITY)
Admission: AD | Admit: 2017-02-22 | Discharge: 2017-02-23 | Disposition: A | Discharge status: Home / Self Care | Payer: 59 | Source: Ambulatory Visit | Attending: Obstetrics and Gynecology | Admitting: Obstetrics and Gynecology

## 2017-02-22 ENCOUNTER — Other Ambulatory Visit (HOSPITAL_COMMUNITY): Payer: Self-pay | Admitting: Maternal and Fetal Medicine

## 2017-02-22 ENCOUNTER — Encounter (HOSPITAL_COMMUNITY): Payer: Self-pay

## 2017-02-22 ENCOUNTER — Telehealth: Payer: Self-pay

## 2017-02-22 ENCOUNTER — Encounter (HOSPITAL_COMMUNITY): Payer: Self-pay | Admitting: *Deleted

## 2017-02-22 ENCOUNTER — Other Ambulatory Visit: Payer: Self-pay

## 2017-02-22 ENCOUNTER — Other Ambulatory Visit (HOSPITAL_COMMUNITY): Payer: Self-pay | Admitting: *Deleted

## 2017-02-22 DIAGNOSIS — Z3A28 28 weeks gestation of pregnancy: Secondary | ICD-10-CM | POA: Diagnosis not present

## 2017-02-22 DIAGNOSIS — O09293 Supervision of pregnancy with other poor reproductive or obstetric history, third trimester: Secondary | ICD-10-CM

## 2017-02-22 DIAGNOSIS — O09899 Supervision of other high risk pregnancies, unspecified trimester: Secondary | ICD-10-CM

## 2017-02-22 DIAGNOSIS — O403XX0 Polyhydramnios, third trimester, not applicable or unspecified: Secondary | ICD-10-CM

## 2017-02-22 DIAGNOSIS — O24410 Gestational diabetes mellitus in pregnancy, diet controlled: Secondary | ICD-10-CM | POA: Insufficient documentation

## 2017-02-22 DIAGNOSIS — O4703 False labor before 37 completed weeks of gestation, third trimester: Secondary | ICD-10-CM | POA: Diagnosis not present

## 2017-02-22 DIAGNOSIS — O4193X0 Disorder of amniotic fluid and membranes, unspecified, third trimester, not applicable or unspecified: Secondary | ICD-10-CM

## 2017-02-22 DIAGNOSIS — O09219 Supervision of pregnancy with history of pre-term labor, unspecified trimester: Secondary | ICD-10-CM

## 2017-02-22 DIAGNOSIS — O09522 Supervision of elderly multigravida, second trimester: Secondary | ICD-10-CM

## 2017-02-22 DIAGNOSIS — O34219 Maternal care for unspecified type scar from previous cesarean delivery: Secondary | ICD-10-CM

## 2017-02-22 DIAGNOSIS — O09213 Supervision of pregnancy with history of pre-term labor, third trimester: Secondary | ICD-10-CM

## 2017-02-22 DIAGNOSIS — O24419 Gestational diabetes mellitus in pregnancy, unspecified control: Secondary | ICD-10-CM

## 2017-02-22 DIAGNOSIS — Z148 Genetic carrier of other disease: Secondary | ICD-10-CM

## 2017-02-22 DIAGNOSIS — O0990 Supervision of high risk pregnancy, unspecified, unspecified trimester: Secondary | ICD-10-CM

## 2017-02-22 LAB — COMPREHENSIVE METABOLIC PANEL
ALT: 8 U/L — ABNORMAL LOW (ref 14–54)
AST: 15 U/L (ref 15–41)
Albumin: 2.6 g/dL — ABNORMAL LOW (ref 3.5–5.0)
Alkaline Phosphatase: 76 U/L (ref 38–126)
Anion gap: 8 (ref 5–15)
BUN: 6 mg/dL (ref 6–20)
CHLORIDE: 103 mmol/L (ref 101–111)
CO2: 23 mmol/L (ref 22–32)
Calcium: 9.3 mg/dL (ref 8.9–10.3)
Creatinine, Ser: 0.61 mg/dL (ref 0.44–1.00)
GFR calc Af Amer: >60 mL/min (ref >60)
Glucose, Bld: 87 mg/dL (ref 65–99)
POTASSIUM: 4.1 mmol/L (ref 3.5–5.1)
SODIUM: 134 mmol/L — AB (ref 135–145)
Total Bilirubin: 0.6 mg/dL (ref 0.3–1.2)
Total Protein: 6.2 g/dL — ABNORMAL LOW (ref 6.5–8.1)

## 2017-02-22 LAB — CBC
HEMATOCRIT: 32.8 % — AB (ref 36.0–46.0)
Hemoglobin: 10.7 g/dL — ABNORMAL LOW (ref 12.0–15.0)
MCH: 27.7 pg (ref 26.0–34.0)
MCHC: 32.6 g/dL (ref 30.0–36.0)
MCV: 85 fL (ref 78.0–100.0)
Platelets: 217 10*3/uL (ref 150–400)
RBC: 3.86 MIL/uL — ABNORMAL LOW (ref 3.87–5.11)
RDW: 14.2 % (ref 11.5–15.5)
WBC: 8.7 10*3/uL (ref 4.0–10.5)

## 2017-02-22 LAB — TYPE AND SCREEN
ABO/RH(D): O POS
ANTIBODY SCREEN: NEGATIVE

## 2017-02-22 LAB — GLUCOSE, CAPILLARY
GLUCOSE-CAPILLARY: 185 mg/dL — AB (ref 65–99)
GLUCOSE-CAPILLARY: 178 mg/dL — AB (ref 65–99)
Glucose-Capillary: 123 mg/dL — ABNORMAL HIGH (ref 65–99)

## 2017-02-22 LAB — CHROMOSOME ANALYSIS, AMNIOTIC FLUID (PERF AT WFU)

## 2017-02-22 LAB — FETAL FIBRONECTIN: Fetal Fibronectin: NEGATIVE

## 2017-02-22 MED ORDER — ACCU-CHEK FASTCLIX LANCETS MISC: 1.0000 | Freq: Four times a day (QID) | 12 refills | Status: AC

## 2017-02-22 MED ORDER — GLUCOSE BLOOD VI STRP
ORAL_STRIP | 12 refills | Status: AC
Start: 2017-02-22 — End: ?

## 2017-02-22 MED ORDER — BETAMETHASONE SOD PHOS & ACET 6 (3-3) MG/ML IJ SUSP
12.0000 mg | Freq: Once | INTRAMUSCULAR | Status: AC
  Administered 2017-02-23: 12 mg via INTRAMUSCULAR
  Filled 2017-02-22: qty 2

## 2017-02-22 MED ORDER — BETAMETHASONE SOD PHOS & ACET 6 (3-3) MG/ML IJ SUSP
12.0000 mg | Freq: Once | INTRAMUSCULAR | Status: AC
Start: 2017-02-22 — End: 2017-02-22
  Administered 2017-02-22: 12 mg via INTRAMUSCULAR
  Filled 2017-02-22: qty 2

## 2017-02-22 MED ORDER — TERBUTALINE SULFATE 1 MG/ML IJ SOLN
INTRAMUSCULAR | Status: AC
  Filled 2017-02-22: qty 1

## 2017-02-22 MED ORDER — DOXYLAMINE SUCCINATE (SLEEP) 25 MG PO TABS
25.0000 mg | ORAL_TABLET | Freq: Every evening | ORAL | Status: DC | PRN
  Filled 2017-02-22: qty 1

## 2017-02-22 MED ORDER — TERBUTALINE SULFATE 1 MG/ML IJ SOLN
0.2500 mg | Freq: Once | INTRAMUSCULAR | Status: AC
  Administered 2017-02-22: 0.25 mg via SUBCUTANEOUS

## 2017-02-22 MED ORDER — LACTATED RINGERS IV BOLUS (SEPSIS)
1000.0000 mL | Freq: Once | INTRAVENOUS | Status: AC
  Administered 2017-02-22: 1000 mL via INTRAVENOUS

## 2017-02-22 MED ORDER — ZOLPIDEM TARTRATE 5 MG PO TABS: 5.0000 mg | ORAL_TABLET | Freq: Every evening | ORAL | Status: DC | PRN

## 2017-02-22 MED ORDER — DOCUSATE SODIUM 100 MG PO CAPS: 100.0000 mg | ORAL_CAPSULE | Freq: Every day | ORAL | Status: DC

## 2017-02-22 MED ORDER — DOCUSATE SODIUM 100 MG PO CAPS: 100.0000 mg | ORAL_CAPSULE | Freq: Every day | ORAL | Status: DC | PRN

## 2017-02-22 MED ORDER — ACETAMINOPHEN 325 MG PO TABS: 650.0000 mg | ORAL_TABLET | ORAL | Status: DC | PRN

## 2017-02-22 MED ORDER — CALCIUM CARBONATE ANTACID 500 MG PO CHEW: 2.0000 | CHEWABLE_TABLET | ORAL | Status: DC | PRN

## 2017-02-22 MED ORDER — PRENATAL MULTIVITAMIN CH
1.0000 | ORAL_TABLET | Freq: Every day | ORAL | Status: DC
  Administered 2017-02-23: 1 via ORAL
  Filled 2017-02-22: qty 1

## 2017-02-22 MED ORDER — ACCU-CHEK GUIDE W/DEVICE KIT: 1.0000 | PACK | Freq: Four times a day (QID) | 0 refills | Status: AC

## 2017-02-22 MED ORDER — INSULIN ASPART 100 UNIT/ML ~~LOC~~ SOLN
0.0000 [IU] | Freq: Three times a day (TID) | SUBCUTANEOUS | Status: DC
  Administered 2017-02-22 (×2): 4 [IU] via SUBCUTANEOUS
  Administered 2017-02-23: 3 [IU] via SUBCUTANEOUS

## 2017-02-22 NOTE — Progress Notes (Signed)
Report called to High Risk OB unit.  Patient to be admitted to room 311.

## 2017-02-22 NOTE — MAU Note (Signed)
Urine in lab 

## 2017-02-22 NOTE — ED Notes (Signed)
Report given to J. Spurlock-Frizzell, Charity fundraiserN.  Pt to MAU via wheelchair.

## 2017-02-22 NOTE — Progress Notes (Signed)
Called to advise Dr. Vergie LivingPickens of difficulty keeping patient on continuous fetal monitoring. The nurse who answered stated that Dr. Vergie LivingPickens would be up to see the patient when he got out of the surgery he is in now.

## 2017-02-22 NOTE — ED Notes (Signed)
NST attempted, unsuccessful.  FHR 138.  NST cancelled, will do BPP prior to amnioreduction.

## 2017-02-22 NOTE — Telephone Encounter (Signed)
S/w pt's husband and advised of glucose results, and supplies sent to pharmacy, appt is set for diabetic teaching.

## 2017-02-22 NOTE — MAU Note (Signed)
Patient was in MFM for amniocentesis and started feeling faint and having contractions.  Sent to MAU for monitoring.

## 2017-02-22 NOTE — H&P (Signed)
Annette Lambert is a 36 y.o. female  [phone] @[redacted]w[redacted]d  presenting to MFM for amnioreduction related to polyhydramnios and started feeling lightheaded and having contractions while in MFM. After evaluation in MAU and IV fluids, pt continued to have contractions every 5-7 minutes, mild to palpation and causing no pain or discomfort for the patient.  She has poor obstetric hx with previous pregnancy with PPROM, infant with isovaleric acidemia, cesarean section, and neonatal demise 9 days after delivery. She desires TOLAC with this pregnancy.  She reports good fetal movement, denies cramping/contractions, LOF, vaginal bleeding, vaginal itching/burning, urinary symptoms, h/a, n/v, or fever/chills.     OB History    Gravida Para Term Preterm AB Living   6 4 3 1 1 3    SAB TAB Ectopic Multiple Live Births   1 0 0 0 4     Past Medical History:  Diagnosis Date  . Gestational diabetes mellitus (GDM)    Past Surgical History:  Procedure Laterality Date  . CESAREAN SECTION N/A 04/09/2016   Procedure: CESAREAN SECTION;  Surgeon: Levie HeritageJacob J Stinson, DO;  [facility];  Service: Obstetrics;  Laterality: N/A;   Family History: family history is not on file. Social History:  reports that she has never smoked. She has never used smokeless tobacco. She reports that she does not drink alcohol or use drugs.     Maternal Diabetes: Yes:  Diabetes Type:  Diet controlled Genetic Screening: Normal Maternal Ultrasounds/Referrals: Abnormal:  Findings:   Other: Fetal Ultrasounds or other Referrals:  None Maternal Substance Abuse:  No Significant Maternal Medications:  None Significant Maternal Lab Results:  None Other Comments:  massive polyhydramnios, hx previous infant with isovaleric acidemia and neonatal death at 9 days of life  Review of Systems  Constitutional: Negative for chills, fever and malaise/fatigue.  Eyes: Negative for blurred vision.  Respiratory: Negative for cough and shortness of  breath.   Cardiovascular: Negative for chest pain.  Gastrointestinal: Negative for abdominal pain, heartburn and vomiting.  Genitourinary: Negative for dysuria, frequency and urgency.  Musculoskeletal: Negative.   Neurological: Negative for dizziness and headaches.  Psychiatric/Behavioral: Negative for depression.   Maternal Medical History:  Reason for admission: Contractions.   Contractions: Onset was 1-2 hours ago.   Frequency: regular.   Perceived severity is mild.    Fetal activity: Perceived fetal activity is normal.   Last perceived fetal movement was within the past hour.    Prenatal complications: no prenatal complications Prenatal Complications - Diabetes: gestational. Diabetes is managed by diet.      Dilation: Closed Effacement (%): 50 Station: Ballotable Exam by:: Sharen CounterLisa Leftwich-Kirby, CNM Blood pressure (!) 99/58, pulse 64, temperature 97.8 F (36.6 C), temperature source Oral, resp. rate 16, last menstrual period 08/06/2016, SpO2 98 %. Maternal Exam:  Uterine Assessment: Contraction strength is mild.  Contraction duration is 70 seconds. Contraction frequency is regular.   Cervix: Cervix evaluated by digital exam.     Fetal Exam Fetal Monitor Review: Mode: ultrasound.   Baseline rate: 135.  Variability: moderate (6-25 bpm).   Pattern: accelerations present and no decelerations.    Fetal State Assessment: Category I - tracings are normal.     Physical Exam  Nursing note and vitals reviewed. Constitutional: She is oriented to person, place, and time. She appears well-developed and well-nourished.  Neck: Normal range of motion.  Cardiovascular: Normal rate, regular rhythm and normal heart sounds.   Respiratory: Effort normal.  GI: Soft.  Musculoskeletal: Normal range of motion.  Neurological:  She is alert and oriented to person, place, and time. She has normal reflexes.  Skin: Skin is warm and dry.  Psychiatric: She has a normal mood and affect. Her  behavior is normal. Judgment and thought content normal.    Prenatal labs: ABO, Rh: O/Positive/-- (03/29 1330) Antibody: Negative (03/29 1330) Rubella: 20.10 (03/29 1330) RPR: Non Reactive (07/19 1135)  HBsAg: Negative (03/29 1330)  HIV:    GBS:     Assessment/Plan: 36 y.o. [id number] @[redacted]w[redacted]d  1. Polyhydramnios in third trimester complication, single or unspecified fetus   2. Preterm uterine contractions in third trimester, antepartum   3. 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 Prior poor obstetrical history in third trimester, antepartum     Consult Dr Marjo Bickerenney, MFM, and Dr Vergie LivingPickens, attending to review assessment, history, and today's findings Admit to HROB unit CBC, CMP, type and screen on admission Terbutaline 0.25 given SQ  IV fluids given NPO on admission  CBGs Q 4 hours while NPO BMZ now and in 24 hours Pt missed OB appointment today and weekly 17-P not given, will order for admin during hospital admission Observation for preterm labor Continuous EFM/toco   Sharen CounterLisa Leftwich-Kirby 02/22/2017, 3:17 PM

## 2017-02-22 NOTE — Procedures (Signed)
Annette Lambert 12/12/1980 1751w4d  Fetus A Non-Stress Test Interpretation for 02/22/17  Indication: Polyhydramnios  Fetal Heart Rate A Mode: External Baseline Rate (A): 150 bpm Accelerations: None Decelerations: None  Uterine Activity Mode: Toco Contraction Frequency (min): Irreg UC noted. Contraction Duration (sec): 60-100 Contraction Quality: Mild Resting Tone Palpated: Relaxed Resting Time: Adequate  Interpretation (Fetal Testing) Nonstress Test Interpretation: Non-reactive Comments: FHR tracing rev'd by Dr. Marjo Bickerenney

## 2017-02-22 NOTE — ED Notes (Signed)
Pt placed on monitor for NST at 1035 post amnioreduction, BP 97/61.  During monitoring pt having irregular contractions, feeling light headed, weak, pale and clammy BP 85/48.  Dr. Claudean SeveranceWhitecar at the bedside. Continue to monitor.  BP improved 103/61, 105/61.  Dr. Marjo Bickerenney in with pt.  Pt to go to MAU for continuous monitoring and IVF.

## 2017-02-23 DIAGNOSIS — O24415 Gestational diabetes mellitus in pregnancy, controlled by oral hypoglycemic drugs: Secondary | ICD-10-CM | POA: Diagnosis not present

## 2017-02-23 DIAGNOSIS — O403XX0 Polyhydramnios, third trimester, not applicable or unspecified: Secondary | ICD-10-CM

## 2017-02-23 DIAGNOSIS — Z3A28 28 weeks gestation of pregnancy: Secondary | ICD-10-CM | POA: Diagnosis not present

## 2017-02-23 DIAGNOSIS — O4703 False labor before 37 completed weeks of gestation, third trimester: Secondary | ICD-10-CM | POA: Diagnosis not present

## 2017-02-23 LAB — GLUCOSE, CAPILLARY
GLUCOSE-CAPILLARY: 119 mg/dL — AB (ref 65–99)
Glucose-Capillary: 131 mg/dL — ABNORMAL HIGH (ref 65–99)

## 2017-02-23 MED ORDER — METFORMIN HCL 500 MG PO TABS
500.0000 mg | ORAL_TABLET | Freq: Two times a day (BID) | ORAL | 1 refills | Status: DC
Start: 2017-02-23 — End: 2020-06-16

## 2017-02-23 MED ORDER — METFORMIN HCL 500 MG PO TABS
500.0000 mg | ORAL_TABLET | Freq: Two times a day (BID) | ORAL | Status: DC
  Administered 2017-02-23: 500 mg via ORAL
  Filled 2017-02-23 (×3): qty 1

## 2017-02-23 MED ORDER — HYDROXYPROGESTERONE CAPROATE 250 MG/ML IM OIL
250.0000 mg | TOPICAL_OIL | Freq: Once | INTRAMUSCULAR | Status: AC
  Administered 2017-02-23: 250 mg via INTRAMUSCULAR
  Filled 2017-02-23: qty 1

## 2017-02-23 NOTE — Discharge Summary (Signed)
OB Discharge Summary     Patient Name: Annette Lambert DOB: [birth year] MRN: [id number]  Date of admission: 02/22/2017 Delivering MD: This patient has no babies on file.  Date of discharge: 02/23/2017  Admitting diagnosis: 28WKS,CTX Intrauterine pregnancy: [redacted]w[redacted]d    Secondary diagnosis:  Active Problems:   Preterm uterine contractions in third trimester, antepartum  Additional problems: polyhydramnios     Discharge diagnosis: polyhydramnios with preterm contractions in third trimester                                   Hospital course:  Patient with known history of polyhydramnios admitted with preterm contractions at 28 weeks. 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 She denied amnioreduction for symptomatic relief. Patient received 2 doses of BMZ during her hospital stay. Her contractions resolved. She was found stable for discharge home. Patient with GDM diet control. She was started on Metformin due to recent steroid administration. Patient is doing well and is without complaint. She is eager to go home   Physical exam  Vitals:   02/23/17 0205 02/23/17 0414 02/23/17 0807 02/23/17 1245  BP: 108/65 107/61 (!) 92/49 (!) 103/57  Pulse: 82 77 86 92  Resp: 15 16 16 18   Temp: 97.8 F (36.6 C) 97.9 F (36.6 C) 98.3 F (36.8 C) 98.6 F (37 C)  TempSrc: Oral Oral Oral Oral  SpO2: 100% 100% 100% 100%  Weight: 175 lb 6.4 oz (79.6 kg)     Height: 5' 7"  (1.702 m)      General: alert, cooperative and no distress Uterine Fundus: size greater than dates Cervix: closed and long DVT Evaluation: No evidence of DVT seen on physical exam.  FHT: baseline 130, mod variability, +10x10 accels, no decels Toco: uterine irritability  Labs: Lab Results  Component Value Date   WBC 8.7 02/22/2017   HGB 10.7 (L) 02/22/2017   HCT 32.8 (L) 02/22/2017   MCV 85.0 02/22/2017   PLT 217 02/22/2017   CMP Latest Ref Rng & Units 02/22/2017  Glucose 65 -  99 mg/dL 87  BUN 6 - 20 mg/dL 6  Creatinine 0.44 - 1.00 mg/dL 0.61  Sodium 135 - 145 mmol/L 134(L)  Potassium 3.5 - 5.1 mmol/L 4.1  Chloride 101 - 111 mmol/L 103  CO2 22 - 32 mmol/L 23  Calcium 8.9 - 10.3 mg/dL 9.3  Total Protein 6.5 - 8.1 g/dL 6.2(L)  Total Bilirubin 0.3 - 1.2 mg/dL 0.6  Alkaline Phos 38 - 126 U/L 76  AST 15 - 41 U/L 15  ALT 14 - 54 U/L 8(L)    Discharge instruction: per After Visit Summary and "Baby and Me Booklet".  After visit meds:  Allergies as of 02/23/2017   No Known Allergies     Medication List    TAKE these medications   ACCU-CHEK FASTCLIX LANCETS Misc 1 Device by Percutaneous route 4 (four) times daily.   ACCU-CHEK GUIDE w/Device Kit 1 kit by Does not apply route 4 (four) times daily.   acetaminophen 500 MG tablet Commonly known as:  TYLENOL Take 500 mg by mouth every 6 (six) hours as needed for headache.   doxylamine (Sleep) 25 MG tablet Commonly known as:  UNISOM Take 25 mg by mouth at bedtime as needed for sleep.   glucose blood test strip Commonly known as:  ACCU-CHEK GUIDE 4 time daily  hydroxyprogesterone caproate 250 mg/mL Oil injection Commonly known as:  MAKENA Inject 250 mg into the muscle once.   metFORMIN 500 MG tablet Commonly known as:  GLUCOPHAGE Take 1 tablet (500 mg total) by mouth 2 (two) times daily with a meal.   VITAFOL-OB Tabs Take 1 tablet by mouth daily before breakfast.       Diet: carb modified diet  Outpatient follow up: as scheduled Follow up Appt: Future Appointments Date Time Provider [place]  02/27/2017 8:00 AM Ruby Cola, RD [place] NDM  02/27/2017 2:15 PM [place] [facility] M[facility]-US  02/28/2017 3:15 PM Finnick Orosz, Vickii Chafe, MD [place] None  03/05/2017 9:15 AM [facility] [place] 4 WH-MFCUS MFC-US      02/23/2017 Mora Bellman, MD

## 2017-02-23 NOTE — Progress Notes (Signed)
Pt just now ordering dinner tray. Graham crackers given per pt request. Will re-check blood sugar and provide insulin coverage once food tray arrives to pt room.

## 2017-02-23 NOTE — Progress Notes (Addendum)
Dr. Emelda FearFerguson notified of pt CBG of 185 since there were no orders for sliding scale insulin. Dr. Emelda FearFerguson revised orders for CBG checks and put in orders for sliding scale insulin. Told to give pt 4 units of Novolog now.

## 2017-02-24 ENCOUNTER — Other Ambulatory Visit: Payer: Self-pay

## 2017-02-25 ENCOUNTER — Other Ambulatory Visit (HOSPITAL_COMMUNITY): Payer: Self-pay | Admitting: Maternal and Fetal Medicine

## 2017-02-25 DIAGNOSIS — O09219 Supervision of pregnancy with history of pre-term labor, unspecified trimester: Secondary | ICD-10-CM

## 2017-02-25 DIAGNOSIS — O09899 Supervision of other high risk pregnancies, unspecified trimester: Secondary | ICD-10-CM

## 2017-02-25 DIAGNOSIS — O403XX0 Polyhydramnios, third trimester, not applicable or unspecified: Secondary | ICD-10-CM

## 2017-02-25 DIAGNOSIS — O09522 Supervision of elderly multigravida, second trimester: Secondary | ICD-10-CM

## 2017-02-25 DIAGNOSIS — Z3A28 28 weeks gestation of pregnancy: Secondary | ICD-10-CM

## 2017-02-25 DIAGNOSIS — O09293 Supervision of pregnancy with other poor reproductive or obstetric history, third trimester: Secondary | ICD-10-CM

## 2017-02-25 DIAGNOSIS — O34219 Maternal care for unspecified type scar from previous cesarean delivery: Secondary | ICD-10-CM

## 2017-02-27 ENCOUNTER — Other Ambulatory Visit: Payer: Self-pay | Admitting: Obstetrics & Gynecology

## 2017-02-27 ENCOUNTER — Other Ambulatory Visit (HOSPITAL_COMMUNITY): Payer: Self-pay | Admitting: *Deleted

## 2017-02-27 ENCOUNTER — Encounter: Payer: Self-pay | Admitting: Skilled Nursing Facility1

## 2017-02-27 ENCOUNTER — Encounter: Payer: 59 | Attending: Obstetrics & Gynecology | Admitting: Skilled Nursing Facility1

## 2017-02-27 ENCOUNTER — Ambulatory Visit (HOSPITAL_COMMUNITY)
Admission: RE | Admit: 2017-02-27 | Discharge: 2017-02-27 | Disposition: A | Discharge status: Home / Self Care | Payer: 59 | Source: Ambulatory Visit | Attending: Certified Nurse Midwife | Admitting: Certified Nurse Midwife

## 2017-02-27 ENCOUNTER — Encounter (HOSPITAL_COMMUNITY): Payer: Self-pay

## 2017-02-27 DIAGNOSIS — O09213 Supervision of pregnancy with history of pre-term labor, third trimester: Secondary | ICD-10-CM | POA: Insufficient documentation

## 2017-02-27 DIAGNOSIS — O34219 Maternal care for unspecified type scar from previous cesarean delivery: Secondary | ICD-10-CM | POA: Diagnosis not present

## 2017-02-27 DIAGNOSIS — Z3A29 29 weeks gestation of pregnancy: Secondary | ICD-10-CM | POA: Insufficient documentation

## 2017-02-27 DIAGNOSIS — O403XX0 Polyhydramnios, third trimester, not applicable or unspecified: Secondary | ICD-10-CM | POA: Diagnosis not present

## 2017-02-27 DIAGNOSIS — Z713 Dietary counseling and surveillance: Secondary | ICD-10-CM | POA: Diagnosis not present

## 2017-02-27 DIAGNOSIS — O24419 Gestational diabetes mellitus in pregnancy, unspecified control: Secondary | ICD-10-CM | POA: Insufficient documentation

## 2017-02-27 DIAGNOSIS — O352XX1 Maternal care for (suspected) hereditary disease in fetus, fetus 1: Secondary | ICD-10-CM | POA: Insufficient documentation

## 2017-02-27 DIAGNOSIS — Z8759 Personal history of other complications of pregnancy, childbirth and the puerperium: Secondary | ICD-10-CM | POA: Insufficient documentation

## 2017-02-27 DIAGNOSIS — O09293 Supervision of pregnancy with other poor reproductive or obstetric history, third trimester: Secondary | ICD-10-CM | POA: Diagnosis not present

## 2017-02-27 DIAGNOSIS — O409XX0 Polyhydramnios, unspecified trimester, not applicable or unspecified: Secondary | ICD-10-CM

## 2017-02-27 DIAGNOSIS — O09523 Supervision of elderly multigravida, third trimester: Secondary | ICD-10-CM | POA: Insufficient documentation

## 2017-02-27 NOTE — Progress Notes (Signed)
mohamad [phone] pacfic interpretors.   [redacted] weeks along.   About 30 minutes into the appointment pt became visibly uncomfortable. Pt bent over with some effort in breathing. Pt states she needed to lay down so Dietitian brought in a bench for her to lay on and a pillow for comfort. Dietitian had Georges MouseMelissa Thompson sit with her and her husband while dietitian got the bench and pillow. Through the use of the interpretor dietitian asked 3 times a few minutes apart if she could call the doctor or 911 with the pt and her husband responding no I/she is fine, this happens sometimes due to excess fluid but I will be fine I just need a few minutes of rest. Dietitian advised the pt she needs to call 911 but pt was adamant and dietitian stated she will call 911 if this spell goes beyond noted time. Pt felt good enough to understand so dietitian educated her on the points of gestational diabetes control and advised she call when she is feeling better for a follow up appointment.  Pt felt well enough to walk out of the appointment without assistance.   Handouts Given: Gestational Diabetes packet in Arabic

## 2017-02-28 ENCOUNTER — Ambulatory Visit (INDEPENDENT_AMBULATORY_CARE_PROVIDER_SITE_OTHER): Payer: 59 | Admitting: Obstetrics and Gynecology

## 2017-02-28 ENCOUNTER — Other Ambulatory Visit (HOSPITAL_COMMUNITY): Payer: Self-pay | Admitting: *Deleted

## 2017-02-28 VITALS — BP 101/68 | HR 119 | Wt 174.7 lb

## 2017-02-28 DIAGNOSIS — O0993 Supervision of high risk pregnancy, unspecified, third trimester: Secondary | ICD-10-CM

## 2017-02-28 DIAGNOSIS — Z98891 History of uterine scar from previous surgery: Secondary | ICD-10-CM

## 2017-02-28 DIAGNOSIS — O24419 Gestational diabetes mellitus in pregnancy, unspecified control: Secondary | ICD-10-CM

## 2017-02-28 DIAGNOSIS — Z789 Other specified health status: Secondary | ICD-10-CM

## 2017-02-28 DIAGNOSIS — O403XX0 Polyhydramnios, third trimester, not applicable or unspecified: Secondary | ICD-10-CM

## 2017-02-28 DIAGNOSIS — O09213 Supervision of pregnancy with history of pre-term labor, third trimester: Secondary | ICD-10-CM | POA: Diagnosis not present

## 2017-02-28 DIAGNOSIS — O09523 Supervision of elderly multigravida, third trimester: Secondary | ICD-10-CM

## 2017-02-28 DIAGNOSIS — O409XX0 Polyhydramnios, unspecified trimester, not applicable or unspecified: Secondary | ICD-10-CM

## 2017-02-28 DIAGNOSIS — Z148 Genetic carrier of other disease: Secondary | ICD-10-CM

## 2017-02-28 NOTE — Progress Notes (Signed)
Patient presents for ROB and 17P.  Given in RUOQ. Tolerated well. Administrations This Visit    hydroxyprogesterone caproate (MAKENA) 250 mg/mL injection 250 mg    Admin Date 02/28/2017 Action Given Dose 250 mg Route Intramuscular Administered By Maretta BeesMcGlashan, Bertie Simien J, RMA

## 2017-02-28 NOTE — Progress Notes (Signed)
   PRENATAL VISIT NOTE  Subjective:  Annette Lambert is a 36 y.o. L2G4010G6P3113 at 5038w3d being seen today for ongoing prenatal care.  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  Patient reports some contractions.  Contractions: Irregular. Vag. Bleeding: None.  Movement: Present. Denies leaking of fluid.   The following portions of the patient's history were reviewed and updated as appropriate: allergies, current medications, past family history, past medical history, past social history, past surgical history and problem list. Problem list updated.  Objective:   Vitals:   02/28/17 1545  BP: 101/68  Pulse: (!) 119  Weight: 174 lb 11.2 oz (79.2 kg)    Fetal Status: Fetal Heart Rate (bpm): 143 Fundal Height: 46 cm Movement: Present     General:  Alert, oriented and cooperative. Patient is in no acute distress.  Skin: Skin is warm and dry. No rash noted.   Cardiovascular: Normal heart rate noted  Respiratory: Normal respiratory effort, no problems with respiration noted  Abdomen: Soft, gravid, appropriate for gestational age.  Pain/Pressure: Absent     Pelvic: Cervical exam deferred        Extremities: Normal range of motion.  Edema: None  Mental Status:  Normal mood and affect. Normal behavior. Normal judgment and thought content.   Assessment and Plan:  Pregnancy: [id number] at 5338w3d  1. Gestational diabetes mellitus (GDM) in third trimester,  gestational diabetes method of control unspecified Patient did not bring CBG log but reports highest fasting 96 and highest pp 117 Continue metformin for now. Patient completed BMZ on 7/27 and 7/28 Follow up growth ultrasound scheduled Patient scheduled for weekly BPP with MFM through 8/21  2. AMA (advanced maternal age) multigravida 35+, third trimester   3. Polyhydramnios in third trimester complication, single or unspecified fetus Scheduled for amnio reduction tomorrow  4. Supervision of high risk pregnancy, antepartum, third trimester Patient is doing well   5. Limited English speaking patient Husband present during the visit  6. 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 History of C-section Desires TOLAC pending no other complications  Preterm labor symptoms and general obstetric precautions including but not limited to vaginal bleeding, contractions, leaking of fluid and fetal movement were reviewed in detail with the patient. Please refer to After Visit Summary for other counseling recommendations.  Return in about 2 weeks (around 03/14/2017) for ROB.   Catalina AntiguaPeggy Yusef Lamp, MD

## 2017-03-01 ENCOUNTER — Encounter (HOSPITAL_COMMUNITY): Payer: Self-pay

## 2017-03-01 ENCOUNTER — Ambulatory Visit (HOSPITAL_COMMUNITY)
Admission: RE | Admit: 2017-03-01 | Discharge: 2017-03-01 | Disposition: A | Discharge status: Home / Self Care | Payer: 59 | Source: Ambulatory Visit | Attending: Certified Nurse Midwife | Admitting: Certified Nurse Midwife

## 2017-03-01 ENCOUNTER — Ambulatory Visit (HOSPITAL_COMMUNITY): Admission: RE | Admit: 2017-03-01 | Payer: 59 | Source: Ambulatory Visit

## 2017-03-01 ENCOUNTER — Other Ambulatory Visit (HOSPITAL_COMMUNITY): Payer: Self-pay | Admitting: Maternal and Fetal Medicine

## 2017-03-01 DIAGNOSIS — O24419 Gestational diabetes mellitus in pregnancy, unspecified control: Secondary | ICD-10-CM

## 2017-03-01 DIAGNOSIS — Z3A32 32 weeks gestation of pregnancy: Secondary | ICD-10-CM | POA: Diagnosis not present

## 2017-03-01 DIAGNOSIS — Z3A29 29 weeks gestation of pregnancy: Secondary | ICD-10-CM

## 2017-03-01 DIAGNOSIS — O403XX0 Polyhydramnios, third trimester, not applicable or unspecified: Secondary | ICD-10-CM | POA: Diagnosis not present

## 2017-03-01 DIAGNOSIS — O409XX0 Polyhydramnios, unspecified trimester, not applicable or unspecified: Secondary | ICD-10-CM

## 2017-03-01 DIAGNOSIS — O09213 Supervision of pregnancy with history of pre-term labor, third trimester: Secondary | ICD-10-CM

## 2017-03-01 NOTE — Procedures (Signed)
Annette Lambert 07/28/1981 4078w4d  Fetus A Non-Stress Test Interpretation for 03/01/17  Indication: Polyhydramnios   Fetal Heart Rate A Mode: External Baseline Rate (A): 135 bpm Variability: Moderate Accelerations: 10 x 10 Decelerations: None Multiple birth?: No  Uterine Activity Mode: Palpation, Toco Contraction Frequency (min): 5-11 With UI Contraction Duration (sec): 50-100 Contraction Quality: Mild (Pt denies feeling ) Resting Tone Palpated: Relaxed Resting Time: Adequate  Interpretation (Fetal Testing) Nonstress Test Interpretation: Non-reactive Comments: FHR tracing Rev'd with Dr. Marjo Bickerenney

## 2017-03-05 ENCOUNTER — Ambulatory Visit (HOSPITAL_COMMUNITY)
Admission: RE | Admit: 2017-03-05 | Discharge: 2017-03-05 | Disposition: A | Discharge status: Home / Self Care | Payer: 59 | Source: Ambulatory Visit | Attending: Certified Nurse Midwife | Admitting: Certified Nurse Midwife

## 2017-03-05 ENCOUNTER — Ambulatory Visit (HOSPITAL_COMMUNITY): Payer: 59

## 2017-03-05 ENCOUNTER — Encounter (HOSPITAL_COMMUNITY): Payer: Self-pay

## 2017-03-05 DIAGNOSIS — Z9889 Other specified postprocedural states: Secondary | ICD-10-CM | POA: Insufficient documentation

## 2017-03-05 DIAGNOSIS — O34219 Maternal care for unspecified type scar from previous cesarean delivery: Secondary | ICD-10-CM | POA: Diagnosis not present

## 2017-03-05 DIAGNOSIS — O403XX0 Polyhydramnios, third trimester, not applicable or unspecified: Secondary | ICD-10-CM | POA: Diagnosis present

## 2017-03-05 DIAGNOSIS — O09523 Supervision of elderly multigravida, third trimester: Secondary | ICD-10-CM | POA: Insufficient documentation

## 2017-03-05 DIAGNOSIS — O09213 Supervision of pregnancy with history of pre-term labor, third trimester: Secondary | ICD-10-CM | POA: Diagnosis not present

## 2017-03-05 DIAGNOSIS — O409XX0 Polyhydramnios, unspecified trimester, not applicable or unspecified: Secondary | ICD-10-CM

## 2017-03-05 DIAGNOSIS — O24419 Gestational diabetes mellitus in pregnancy, unspecified control: Secondary | ICD-10-CM | POA: Diagnosis not present

## 2017-03-05 DIAGNOSIS — Z3A30 30 weeks gestation of pregnancy: Secondary | ICD-10-CM | POA: Insufficient documentation

## 2017-03-05 DIAGNOSIS — O09293 Supervision of pregnancy with other poor reproductive or obstetric history, third trimester: Secondary | ICD-10-CM | POA: Diagnosis not present

## 2017-03-07 ENCOUNTER — Telehealth (HOSPITAL_COMMUNITY): Payer: Self-pay | Admitting: MS"

## 2017-03-07 ENCOUNTER — Telehealth (HOSPITAL_COMMUNITY): Payer: Self-pay

## 2017-03-07 NOTE — Telephone Encounter (Signed)
Called patient with an Arabic [employer] interpreter [phone]). Patient was identified by name and DOB. 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 The parents were both previously tested and were found to be carriers of the same (consanguineous relationship) IVD mutation. We reviewed this condition again in detail including the features and the variability--features can range from very mild to life-threatening- and typically become apparent within the first few days of life. We again discussed that treatment involves a protein-restricted diet and supplementation of carnitine and glycine. We discussed the importance of the baby being followed by a metabolic geneticist/clinic. We discussed that a metabolic specialist can coordinate care and management for the baby. We discussed the recommendation for either delivery of the baby in a center with a metabolic clinic or for immediate transfer of the newborn. Dr. Sherrie Georgeecker, MFM, will [facility].   I spent significant time reviewing the importance of immediate care of the newborn and that if she goes into labor (severe polyhydramnios) prior to transfer of care, that she should remind the providers that the baby has a severe metabolic condition and requires a special diet. The patient wrote down the name of the condition and will make sure that she gives this information to her providers, if this happens. We will contact her with additional plans once coordinated.  All questions were answered and concerns addressed. Patient was very worried today, given that her last baby passed away from complications of this condition. I spent much time reviewing that the prognosis is expected to be different given that we know ahead of time that this baby (the current pregnancy) has inherited the same condition.   Annette Ariashristy Maraki Macquarrie, MS  CGC

## 2017-03-07 NOTE — Telephone Encounter (Signed)
Spoke with Morrie SheldonAshley, referral coordinator with [facility]. Per previous discussion and communication with Dr. 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 Morrie Sheldonshley will contact patient with Arabic interpreter to inform her to time and place of patient's appointment with [facility] on Monday, 03/11/17 for transfer of OB care.    Clydie BraunKaren Alben Jepsen 03/07/2017 12:25 PM

## 2017-03-08 ENCOUNTER — Ambulatory Visit (INDEPENDENT_AMBULATORY_CARE_PROVIDER_SITE_OTHER): Payer: 59

## 2017-03-08 DIAGNOSIS — O09219 Supervision of pregnancy with history of pre-term labor, unspecified trimester: Secondary | ICD-10-CM

## 2017-03-08 DIAGNOSIS — O09213 Supervision of pregnancy with history of pre-term labor, third trimester: Secondary | ICD-10-CM

## 2017-03-08 MED ORDER — HYDROXYPROGESTERONE CAPROATE 250 MG/ML IM OIL
250.0000 mg | TOPICAL_OIL | Freq: Once | INTRAMUSCULAR | Status: AC
  Administered 2017-03-08: 250 mg via INTRAMUSCULAR

## 2017-03-08 NOTE — Progress Notes (Signed)
Agree with nursing staff's documentation of this patient's clinic encounter.  Jamieson Hetland, MD    

## 2017-03-08 NOTE — Progress Notes (Signed)
Nurse visit for pt supply 17p given L upper outer quad w/o difficulty.

## 2017-03-12 ENCOUNTER — Other Ambulatory Visit: Payer: Self-pay | Admitting: Certified Nurse Midwife

## 2017-03-12 ENCOUNTER — Other Ambulatory Visit (HOSPITAL_COMMUNITY): Payer: Self-pay | Admitting: *Deleted

## 2017-03-12 DIAGNOSIS — O409XX0 Polyhydramnios, unspecified trimester, not applicable or unspecified: Secondary | ICD-10-CM

## 2017-03-13 ENCOUNTER — Ambulatory Visit (HOSPITAL_COMMUNITY): Payer: 59

## 2017-03-14 ENCOUNTER — Inpatient Hospital Stay (HOSPITAL_COMMUNITY): Payer: 59

## 2017-03-14 ENCOUNTER — Ambulatory Visit (HOSPITAL_COMMUNITY): Admission: RE | Admit: 2017-03-14 | Payer: 59 | Source: Ambulatory Visit

## 2017-03-14 ENCOUNTER — Ambulatory Visit (HOSPITAL_COMMUNITY)
Admission: RE | Admit: 2017-03-14 | Discharge: 2017-03-14 | Disposition: A | Discharge status: Home / Self Care | Payer: 59 | Source: Ambulatory Visit | Attending: Maternal and Fetal Medicine | Admitting: Maternal and Fetal Medicine

## 2017-03-14 ENCOUNTER — Encounter (HOSPITAL_COMMUNITY): Payer: Self-pay

## 2017-03-14 ENCOUNTER — Ambulatory Visit (INDEPENDENT_AMBULATORY_CARE_PROVIDER_SITE_OTHER): Payer: 59 | Admitting: Obstetrics and Gynecology

## 2017-03-14 ENCOUNTER — Encounter (HOSPITAL_COMMUNITY): Payer: Self-pay | Admitting: General Practice

## 2017-03-14 ENCOUNTER — Inpatient Hospital Stay (EMERGENCY_DEPARTMENT_HOSPITAL)
Admission: AD | Admit: 2017-03-14 | Discharge: 2017-03-14 | Disposition: A | Discharge status: Home / Self Care | Payer: 59 | Source: Ambulatory Visit | Attending: Family Medicine | Admitting: Family Medicine

## 2017-03-14 VITALS — BP 114/72 | HR 95 | Wt 164.6 lb

## 2017-03-14 DIAGNOSIS — O24415 Gestational diabetes mellitus in pregnancy, controlled by oral hypoglycemic drugs: Secondary | ICD-10-CM | POA: Insufficient documentation

## 2017-03-14 DIAGNOSIS — O24419 Gestational diabetes mellitus in pregnancy, unspecified control: Secondary | ICD-10-CM

## 2017-03-14 DIAGNOSIS — O0993 Supervision of high risk pregnancy, unspecified, third trimester: Secondary | ICD-10-CM

## 2017-03-14 DIAGNOSIS — O09523 Supervision of elderly multigravida, third trimester: Secondary | ICD-10-CM | POA: Diagnosis not present

## 2017-03-14 DIAGNOSIS — O403XX0 Polyhydramnios, third trimester, not applicable or unspecified: Secondary | ICD-10-CM | POA: Diagnosis not present

## 2017-03-14 DIAGNOSIS — O409XX0 Polyhydramnios, unspecified trimester, not applicable or unspecified: Secondary | ICD-10-CM

## 2017-03-14 DIAGNOSIS — Z3689 Encounter for other specified antenatal screening: Secondary | ICD-10-CM

## 2017-03-14 DIAGNOSIS — O09293 Supervision of pregnancy with other poor reproductive or obstetric history, third trimester: Secondary | ICD-10-CM | POA: Diagnosis present

## 2017-03-14 DIAGNOSIS — O34211 Maternal care for low transverse scar from previous cesarean delivery: Secondary | ICD-10-CM | POA: Diagnosis not present

## 2017-03-14 DIAGNOSIS — O352XX1 Maternal care for (suspected) hereditary disease in fetus, fetus 1: Secondary | ICD-10-CM

## 2017-03-14 DIAGNOSIS — Z3A31 31 weeks gestation of pregnancy: Secondary | ICD-10-CM | POA: Insufficient documentation

## 2017-03-14 DIAGNOSIS — Z98891 History of uterine scar from previous surgery: Secondary | ICD-10-CM

## 2017-03-14 DIAGNOSIS — O09213 Supervision of pregnancy with history of pre-term labor, third trimester: Secondary | ICD-10-CM

## 2017-03-14 DIAGNOSIS — Z148 Genetic carrier of other disease: Secondary | ICD-10-CM

## 2017-03-14 HISTORY — DX: Gestational diabetes mellitus in pregnancy, unspecified control: O24.419

## 2017-03-14 MED ORDER — LACTATED RINGERS IV SOLN
INTRAVENOUS | Status: DC
  Administered 2017-03-14: 14:00:00 via INTRAVENOUS

## 2017-03-14 MED ORDER — LACTATED RINGERS IV BOLUS (SEPSIS)
1000.0000 mL | Freq: Once | INTRAVENOUS | Status: AC
  Administered 2017-03-14: 1000 mL via INTRAVENOUS

## 2017-03-14 MED ORDER — HYDROXYPROGESTERONE CAPROATE 250 MG/ML IM OIL
250.0000 mg | TOPICAL_OIL | Freq: Once | INTRAMUSCULAR | Status: AC
Start: 2017-03-14 — End: 2017-03-14
  Administered 2017-03-14: 250 mg via INTRAMUSCULAR

## 2017-03-14 NOTE — Progress Notes (Signed)
   PRENATAL VISIT NOTE  Subjective:  Annette MooreSahar Lambert is a 36 y.o. [id number] at 4774w3d being seen today for ongoing prenatal care.  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  Patient reports feeling better s/p amnio-reduction performed this morning.  Contractions: Irregular. Vag. Bleeding: None.  Movement: Present. Denies leaking of fluid.   The following portions of the patient's history were reviewed and updated as appropriate: allergies, current medications, past family history, past medical history, past social history, past surgical history and problem list. Problem list updated.  Objective:   Vitals:   03/14/17 1543  BP: 114/72  Pulse: 95  Weight: 164 lb 9.6 oz (74.7 kg)    Fetal Status: Fetal Heart Rate (bpm): 146 Fundal Height: 34 cm Movement: Present     General:  Alert, oriented and cooperative. Patient is in no acute distress.  Skin: Skin is warm and dry. No rash noted.   Cardiovascular: Normal heart rate noted  Respiratory: Normal respiratory effort, no problems with respiration noted  Abdomen: Soft, gravid, appropriate for gestational age.  Pain/Pressure: Absent     Pelvic: Cervical exam deferred        Extremities: Normal range of motion.  Edema: None  Mental Status:  Normal mood and affect. Normal behavior. Normal judgment and thought content.   Assessment and Plan:  Pregnancy: [id number] at 7474w3d  1. Polyhydramnios in third  trimester complication, single or unspecified fetus S/p amnioreduction today  2. Hereditary disease in family possibly affecting fetus, fetus 1 [facility]  3. 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 Gestational diabetes mellitus (GDM) in third trimester, gestational diabetes method of control unspecified CBGs reviewed and all within range Continue Metformin Follow up BPP next week with NST  5. Supervision of high risk pregnancy, antepartum, third trimester Patient is doing well  6. History of C-section Patient is still interested in TOLAC  Preterm labor symptoms and general obstetric precautions including but not limited to vaginal bleeding, contractions, leaking of fluid and fetal movement were reviewed in detail with the patient. Please refer to After Visit Summary for other counseling recommendations.  Return in about 1 week (around 03/21/2017) for ROB, NST, weekly for 17-P.   Catalina AntiguaPeggy Irina Okelly, MD

## 2017-03-14 NOTE — ED Notes (Signed)
Pt here today for amnioreduction in [facility].  Dr. Kerin RansomPratt/Carlos notified prior to procedure.  MAU given report from M. Ardelia Memsobb, RN post procedure for further monitoring.  Pt/FOB ambulated and signed into MAU.  Charge RN notified of pt arrival.

## 2017-03-14 NOTE — Progress Notes (Signed)
Pt supplied 17p given R upper outer quad w/o complaints today. CBG's available per pt.

## 2017-03-14 NOTE — MAU Note (Signed)
Sent from MFM after amnioreduction for monitoring.

## 2017-03-14 NOTE — Discharge Instructions (Signed)

## 2017-03-14 NOTE — MAU Provider Note (Signed)
Patient Annette Lambert is a 36 y.o. [id number] at 37w3dhere for two hours of monitoring s/p amnioreduction. Patient had over 5,000 ml of fluid removed from her uterus today.  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  Per MFM, patient to be monitoring for two hours for NST and then have repeat BPP.   Patient denies any complaints of vaginal bleeding, decreased fetal movements. She endorses some occasional contractions.  History     CSN: [id number] Arrival date and time: 03/14/17 1114   None     Chief Complaint  Patient presents with  . fetal monitoring   HPI  OB History    Gravida Para Term Preterm AB Living   6 4 3 1 1 3    SAB TAB Ectopic Multiple Live Births   1 0 0 0 4      Past Medical History:  Diagnosis Date  . Gestational diabetes   . Gestational diabetes mellitus (GDM)     Past Surgical History:  Procedure Laterality Date  . CESAREAN SECTION N/A 04/09/2016   Procedure: CESAREAN SECTION;  Surgeon: JTruett Mainland DO;  [place]  Service: Obstetrics;  Laterality: N/A;    No family history on file.  Social History  Substance Use Topics  . Smoking status: Never Smoker  . Smokeless tobacco: Never Used  . Alcohol use No    Allergies: No Known Allergies  Facility-Administered Medications Prior to Admission  Medication Dose Route Frequency Provider Last Rate Last Dose  . hydroxyprogesterone caproate (MAKENA) 250 mg/mL injection 250 mg  250 mg Intramuscular Weekly Constant, Peggy, MD   250 mg at 02/28/17 1547   Prescriptions Prior to Admission  Medication Sig Dispense Refill Last Dose  . acetaminophen (TYLENOL) 500 MG tablet Take 500 mg by mouth every 6 (six) hours as needed for headache.   Past Month at Unknown time  . doxylamine, Sleep, (UNISOM) 25 MG tablet Take 25 mg by mouth at bedtime as needed for sleep.    03/13/2017 at Unknown time  . hydroxyprogesterone caproate  (MAKENA) 250 mg/mL OIL injection Inject 250 mg into the muscle once.   Past Week at Unknown time  . metFORMIN (GLUCOPHAGE) 500 MG tablet Take 1 tablet (500 mg total) by mouth 2 (two) times daily with a meal. 60 tablet 1 03/14/2017 at Unknown time  . Prenatal Vit-Fe Fumarate-FA (VITAFOL-OB) TABS Take 1 tablet by mouth daily before breakfast. 90 each 3 03/13/2017 at Unknown time  . ACCU-CHEK FASTCLIX LANCETS MISC 1 Device by Percutaneous route 4 (four) times daily. 100 each 12 Taking  . Blood Glucose Monitoring Suppl (ACCU-CHEK GUIDE) w/Device KIT 1 kit by Does not apply route 4 (four) times daily. 1 kit 0 Taking  . glucose blood (ACCU-CHEK GUIDE) test strip 4 time daily 100 each 12 Taking    Review of Systems  HENT: Negative.   Respiratory: Negative.   Cardiovascular: Negative.   Gastrointestinal: Negative.   Genitourinary: Negative.   Musculoskeletal: Negative.   Neurological: Negative.    Physical Exam   Blood pressure (!) 106/57, pulse 87, temperature 97.8 F (36.6 C), temperature source Oral, resp. rate 16, last menstrual period 08/06/2016, SpO2 100 %.  Physical Exam  Constitutional: She is oriented to person, place, and time.  HENT:  Head: Normocephalic.  Eyes: Pupils are equal, round, and reactive to light.  Neck: Normal range of motion.  Respiratory: Effort normal.  GI: Soft. Bowel  sounds are normal.  Musculoskeletal: Normal range of motion.  Neurological: She is alert and oriented to person, place, and time.  Skin: Skin is warm and dry.  Psychiatric: She has a normal mood and affect.    MAU Course  Procedures  MDM -NST: 135 bpm. At first, fetus had some prolonged decelerations; IV fluid bolus was started. Decelerations resolved; moderate variability. Occasional contractions.   MFM notified and came to the bedside to assess patient. Per Dr. Ernestina Patches, patient to continue her monitoring and MFM will reevaluate after BPP.  -BPP today 8/8 Assessment and Plan   1. NST  (non-stress test) reactive    2. Patient had an 8/8 BPP; per MFM patient is stable for discharge and will continue monitoring schedule.   Mervyn Skeeters Tashay Bozich CNM 03/14/2017, 1:18 PM

## 2017-03-15 NOTE — Addendum Note (Signed)
Encounter addended by: Vanetta Shawl, RT, RVT, RDMS on: 03/15/2017 10:28 AM<BR>    Actions taken: Imaging Exam ended

## 2017-03-15 NOTE — Addendum Note (Signed)
Encounter addended by: Vanetta Shawl, RT, RVT, RDMS on: 03/15/2017 11:13 AM<BR>    Actions taken: Imaging Exam ended, Charge Capture section accepted

## 2017-03-19 ENCOUNTER — Ambulatory Visit (HOSPITAL_COMMUNITY)
Admission: RE | Admit: 2017-03-19 | Discharge: 2017-03-19 | Disposition: A | Discharge status: Home / Self Care | Payer: 59 | Source: Ambulatory Visit | Attending: Certified Nurse Midwife | Admitting: Certified Nurse Midwife

## 2017-03-19 ENCOUNTER — Encounter (HOSPITAL_COMMUNITY): Payer: Self-pay

## 2017-03-19 ENCOUNTER — Other Ambulatory Visit (HOSPITAL_COMMUNITY): Payer: Self-pay | Admitting: Maternal and Fetal Medicine

## 2017-03-19 DIAGNOSIS — Z98891 History of uterine scar from previous surgery: Secondary | ICD-10-CM

## 2017-03-19 DIAGNOSIS — Z3A32 32 weeks gestation of pregnancy: Secondary | ICD-10-CM | POA: Diagnosis not present

## 2017-03-19 DIAGNOSIS — O359XX0 Maternal care for (suspected) fetal abnormality and damage, unspecified, not applicable or unspecified: Secondary | ICD-10-CM | POA: Diagnosis not present

## 2017-03-19 DIAGNOSIS — O409XX3 Polyhydramnios, unspecified trimester, fetus 3: Secondary | ICD-10-CM | POA: Insufficient documentation

## 2017-03-19 DIAGNOSIS — O09299 Supervision of pregnancy with other poor reproductive or obstetric history, unspecified trimester: Secondary | ICD-10-CM | POA: Diagnosis not present

## 2017-03-19 DIAGNOSIS — O409XX0 Polyhydramnios, unspecified trimester, not applicable or unspecified: Secondary | ICD-10-CM

## 2017-03-19 DIAGNOSIS — O09219 Supervision of pregnancy with history of pre-term labor, unspecified trimester: Secondary | ICD-10-CM | POA: Diagnosis not present

## 2017-03-19 DIAGNOSIS — O403XX0 Polyhydramnios, third trimester, not applicable or unspecified: Secondary | ICD-10-CM

## 2017-03-19 DIAGNOSIS — O34219 Maternal care for unspecified type scar from previous cesarean delivery: Secondary | ICD-10-CM | POA: Diagnosis not present

## 2017-03-19 DIAGNOSIS — O09522 Supervision of elderly multigravida, second trimester: Secondary | ICD-10-CM | POA: Insufficient documentation

## 2017-03-19 DIAGNOSIS — O09213 Supervision of pregnancy with history of pre-term labor, third trimester: Secondary | ICD-10-CM

## 2017-03-19 DIAGNOSIS — O24415 Gestational diabetes mellitus in pregnancy, controlled by oral hypoglycemic drugs: Secondary | ICD-10-CM

## 2017-03-19 DIAGNOSIS — O24419 Gestational diabetes mellitus in pregnancy, unspecified control: Secondary | ICD-10-CM | POA: Insufficient documentation

## 2017-03-20 ENCOUNTER — Other Ambulatory Visit (HOSPITAL_COMMUNITY): Payer: Self-pay | Admitting: *Deleted

## 2017-03-21 ENCOUNTER — Ambulatory Visit (INDEPENDENT_AMBULATORY_CARE_PROVIDER_SITE_OTHER): Payer: 59 | Admitting: Obstetrics and Gynecology

## 2017-03-21 VITALS — BP 100/66 | HR 96 | Wt 164.0 lb

## 2017-03-21 DIAGNOSIS — O09213 Supervision of pregnancy with history of pre-term labor, third trimester: Secondary | ICD-10-CM | POA: Diagnosis not present

## 2017-03-21 DIAGNOSIS — O24419 Gestational diabetes mellitus in pregnancy, unspecified control: Secondary | ICD-10-CM

## 2017-03-21 DIAGNOSIS — Z148 Genetic carrier of other disease: Secondary | ICD-10-CM

## 2017-03-21 DIAGNOSIS — O4703 False labor before 37 completed weeks of gestation, third trimester: Secondary | ICD-10-CM

## 2017-03-21 DIAGNOSIS — O403XX0 Polyhydramnios, third trimester, not applicable or unspecified: Secondary | ICD-10-CM

## 2017-03-21 DIAGNOSIS — O0993 Supervision of high risk pregnancy, unspecified, third trimester: Secondary | ICD-10-CM

## 2017-03-21 DIAGNOSIS — O09523 Supervision of elderly multigravida, third trimester: Secondary | ICD-10-CM

## 2017-03-21 MED ORDER — HYDROXYPROGESTERONE CAPROATE 250 MG/ML IM OIL
250.0000 mg | TOPICAL_OIL | Freq: Once | INTRAMUSCULAR | Status: AC
  Administered 2017-03-21: 250 mg via INTRAMUSCULAR

## 2017-03-21 NOTE — Progress Notes (Addendum)
Pt supply 17p given L upper outer quad w/o difficulty.

## 2017-03-21 NOTE — Progress Notes (Signed)
Subjective:  Annette Lambert is a 36 y.o. [id number] at [redacted]w[redacted]d being seen today for ongoing prenatal care.  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  Patient reports no complaints.  Contractions: Irregular. Vag. Bleeding: None.  Movement: Present. Denies leaking of fluid.   The following portions of the patient's history were reviewed and updated as appropriate: allergies, current medications, past family history, past medical history, past social history, past surgical history and problem list. Problem list updated.  Objective:   Vitals:   03/21/17 1001  BP: 100/66  Pulse: 96  Weight: 164 lb (74.4 kg)    Fetal Status: Fetal Heart Rate (bpm): NST    Movement: Present     General:  Alert, oriented and cooperative. Patient is in no acute distress.  Skin: Skin is warm and dry. No rash noted.   Cardiovascular: Normal heart rate noted  Respiratory: Normal respiratory effort, no problems with respiration noted  Abdomen: Soft, gravid, appropriate for gestational age. Pain/Pressure: Absent     Pelvic:  Cervical exam deferred        Extremities: Normal range of motion.  Edema: None  Mental Status: Normal mood and affect. Normal behavior. Normal judgment and thought content.   Urinalysis:      Assessment and Plan:  Pregnancy: S2G3151 at [redacted]w[redacted]d  1. Supervision of high risk pregnancy, antepartum, third trimester Stable - Fetal nonstress test  2.  Gestational diabetes mellitus (GDM) in third trimester, gestational diabetes method of control unspecified BS in goal range except for a few Diet choices Continue with Metformin and antenatal testing - Fetal nonstress test  3. Preterm uterine contractions in third trimester, antepartum Stable Continue with 17 OHP  4. Pregnancy complicated by previous preterm labor, third trimester Stable  5. Polyhydramnios in third trimester complication, single or unspecified fetus Stable  6. AMA (advanced maternal age) multigravida 35+, third trimester   7. 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 Please refer to After Visit Summary for other counseling recommendations.  Return in about 1 week (around 03/28/2017) for OB visit.   Hermina Staggers, MD

## 2017-03-22 ENCOUNTER — Telehealth: Payer: Self-pay | Admitting: *Deleted

## 2017-03-26 ENCOUNTER — Other Ambulatory Visit: Payer: Self-pay | Admitting: Certified Nurse Midwife

## 2017-03-26 DIAGNOSIS — Z148 Genetic carrier of other disease: Secondary | ICD-10-CM

## 2017-03-27 ENCOUNTER — Other Ambulatory Visit (HOSPITAL_COMMUNITY): Payer: Self-pay

## 2017-03-28 ENCOUNTER — Ambulatory Visit (HOSPITAL_COMMUNITY)
Admission: RE | Admit: 2017-03-28 | Discharge: 2017-03-28 | Disposition: A | Discharge status: Home / Self Care | Payer: 59 | Source: Ambulatory Visit | Attending: Certified Nurse Midwife | Admitting: Certified Nurse Midwife

## 2017-03-28 ENCOUNTER — Encounter (HOSPITAL_COMMUNITY): Payer: Self-pay

## 2017-03-28 ENCOUNTER — Ambulatory Visit (INDEPENDENT_AMBULATORY_CARE_PROVIDER_SITE_OTHER): Payer: 59 | Admitting: Obstetrics and Gynecology

## 2017-03-28 VITALS — BP 100/66 | HR 108 | Wt 171.0 lb

## 2017-03-28 DIAGNOSIS — Z3A33 33 weeks gestation of pregnancy: Secondary | ICD-10-CM | POA: Insufficient documentation

## 2017-03-28 DIAGNOSIS — O359XX0 Maternal care for (suspected) fetal abnormality and damage, unspecified, not applicable or unspecified: Secondary | ICD-10-CM | POA: Diagnosis not present

## 2017-03-28 DIAGNOSIS — O403XX0 Polyhydramnios, third trimester, not applicable or unspecified: Secondary | ICD-10-CM | POA: Diagnosis not present

## 2017-03-28 DIAGNOSIS — O0993 Supervision of high risk pregnancy, unspecified, third trimester: Secondary | ICD-10-CM

## 2017-03-28 DIAGNOSIS — O09219 Supervision of pregnancy with history of pre-term labor, unspecified trimester: Secondary | ICD-10-CM | POA: Diagnosis not present

## 2017-03-28 DIAGNOSIS — Z789 Other specified health status: Secondary | ICD-10-CM

## 2017-03-28 DIAGNOSIS — Z148 Genetic carrier of other disease: Secondary | ICD-10-CM

## 2017-03-28 DIAGNOSIS — O34219 Maternal care for unspecified type scar from previous cesarean delivery: Secondary | ICD-10-CM | POA: Insufficient documentation

## 2017-03-28 DIAGNOSIS — O409XX0 Polyhydramnios, unspecified trimester, not applicable or unspecified: Secondary | ICD-10-CM

## 2017-03-28 DIAGNOSIS — O09299 Supervision of pregnancy with other poor reproductive or obstetric history, unspecified trimester: Secondary | ICD-10-CM | POA: Diagnosis not present

## 2017-03-28 DIAGNOSIS — O09213 Supervision of pregnancy with history of pre-term labor, third trimester: Secondary | ICD-10-CM | POA: Diagnosis not present

## 2017-03-28 DIAGNOSIS — O09523 Supervision of elderly multigravida, third trimester: Secondary | ICD-10-CM

## 2017-03-28 DIAGNOSIS — O24419 Gestational diabetes mellitus in pregnancy, unspecified control: Secondary | ICD-10-CM | POA: Insufficient documentation

## 2017-03-28 DIAGNOSIS — O09522 Supervision of elderly multigravida, second trimester: Secondary | ICD-10-CM | POA: Diagnosis not present

## 2017-03-28 DIAGNOSIS — Z98891 History of uterine scar from previous surgery: Secondary | ICD-10-CM

## 2017-03-28 NOTE — Progress Notes (Signed)
   PRENATAL VISIT NOTE  Subjective:  Annette Lambert is a 36 y.o. [id number] at 6684w3d being seen today for ongoing prenatal care.  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  Patient reports no complaints.  Contractions: Irregular. Vag. Bleeding: None.  Movement: Present. Denies leaking of fluid.   The following portions of the patient's history were reviewed and updated as appropriate: allergies, current medications, past family history, past medical history, past social history, past surgical history and problem list. Problem list updated.  Objective:   Vitals:   03/28/17 1328  BP: 100/66  Pulse: (!) 108  Weight: 171 lb (77.6 kg)    Fetal Status: Fetal Heart Rate (bpm): 135 Fundal Height: 42 cm Movement: Present     General:  Alert, oriented and cooperative. Patient is in no acute distress.  Skin: Skin is warm and dry. No rash noted.   Cardiovascular: Normal heart rate noted  Respiratory: Normal respiratory effort, no problems with respiration noted  Abdomen: Soft, gravid, appropriate for gestational age.  Pain/Pressure: Absent     Pelvic: Cervical exam deferred        Extremities: Normal range of motion.  Edema: None  Mental Status:  Normal mood and affect. Normal behavior. Normal judgment and thought content.   Assessment and Plan:  Pregnancy: [id number] at 6284w3d  1. 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 Supervision of high risk pregnancy, antepartum, third trimester Patient is doing well  3. AMA (advanced maternal age) multigravida 35+, third trimester MaterniT21 normal  4. Pregnancy complicated by previous preterm labor, third trimester Continue weekly 17-P  5. Limited English speaking patient   6. History of C-section Desires TOLAC  7. GDMA1 CBGs reviewed and great majority within range Continue diet control Growth ultrasound scheduled 9/13  Preterm labor symptoms and general obstetric precautions including but not limited to vaginal bleeding, contractions, leaking of fluid and fetal movement were reviewed in detail with the patient. Please refer to After Visit Summary for other counseling recommendations.  Return in about 1 week (around 04/04/2017) for ROB, weekly for 17-P.   Catalina AntiguaPeggy Jaeven Wanzer, MD

## 2017-04-02 ENCOUNTER — Other Ambulatory Visit (HOSPITAL_COMMUNITY): Payer: 59

## 2017-04-03 ENCOUNTER — Ambulatory Visit (INDEPENDENT_AMBULATORY_CARE_PROVIDER_SITE_OTHER): Payer: 59 | Admitting: Obstetrics and Gynecology

## 2017-04-03 ENCOUNTER — Ambulatory Visit (HOSPITAL_COMMUNITY)
Admission: RE | Admit: 2017-04-03 | Discharge: 2017-04-03 | Disposition: A | Discharge status: Home / Self Care | Payer: 59 | Source: Ambulatory Visit | Attending: Maternal and Fetal Medicine | Admitting: Maternal and Fetal Medicine

## 2017-04-03 ENCOUNTER — Encounter (HOSPITAL_COMMUNITY): Payer: Self-pay

## 2017-04-03 VITALS — BP 106/66 | HR 93 | Wt 173.3 lb

## 2017-04-03 DIAGNOSIS — Z23 Encounter for immunization: Secondary | ICD-10-CM

## 2017-04-03 DIAGNOSIS — Z148 Genetic carrier of other disease: Secondary | ICD-10-CM

## 2017-04-03 DIAGNOSIS — O350XX0 Maternal care for (suspected) central nervous system malformation in fetus, not applicable or unspecified: Secondary | ICD-10-CM | POA: Insufficient documentation

## 2017-04-03 DIAGNOSIS — O09213 Supervision of pregnancy with history of pre-term labor, third trimester: Secondary | ICD-10-CM

## 2017-04-03 DIAGNOSIS — O0993 Supervision of high risk pregnancy, unspecified, third trimester: Secondary | ICD-10-CM

## 2017-04-03 DIAGNOSIS — O09293 Supervision of pregnancy with other poor reproductive or obstetric history, third trimester: Secondary | ICD-10-CM | POA: Diagnosis not present

## 2017-04-03 DIAGNOSIS — O24419 Gestational diabetes mellitus in pregnancy, unspecified control: Secondary | ICD-10-CM | POA: Insufficient documentation

## 2017-04-03 DIAGNOSIS — Z98891 History of uterine scar from previous surgery: Secondary | ICD-10-CM

## 2017-04-03 DIAGNOSIS — O403XX0 Polyhydramnios, third trimester, not applicable or unspecified: Secondary | ICD-10-CM | POA: Diagnosis not present

## 2017-04-03 DIAGNOSIS — O24415 Gestational diabetes mellitus in pregnancy, controlled by oral hypoglycemic drugs: Secondary | ICD-10-CM

## 2017-04-03 DIAGNOSIS — Z3A34 34 weeks gestation of pregnancy: Secondary | ICD-10-CM | POA: Diagnosis present

## 2017-04-03 DIAGNOSIS — O09523 Supervision of elderly multigravida, third trimester: Secondary | ICD-10-CM | POA: Insufficient documentation

## 2017-04-03 DIAGNOSIS — O409XX0 Polyhydramnios, unspecified trimester, not applicable or unspecified: Secondary | ICD-10-CM | POA: Diagnosis present

## 2017-04-03 NOTE — Progress Notes (Signed)
   PRENATAL VISIT NOTE  Subjective:  Annette Lambert is a 36 y.o. [id number] at 4771w2d being seen today for ongoing prenatal care.  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  Patient reports no complaints.  Contractions: Not present. Vag. Bleeding: None.  Movement: Present. Denies leaking of fluid.   The following portions of the patient's history were reviewed and updated as appropriate: allergies, current medications, past family history, past medical history, past social history, past surgical history and problem list. Problem list updated.  Objective:   Vitals:   04/03/17 1309  BP: 106/66  Pulse: 93  Weight: 173 lb 4.8 oz (78.6 kg)    Fetal Status: Fetal Heart Rate (bpm): 142   Movement: Present     General:  Alert, oriented and cooperative. Patient is in no acute distress.  Skin: Skin is warm and dry. No rash noted.   Cardiovascular: Normal heart rate noted  Respiratory: Normal respiratory effort, no problems with respiration noted  Abdomen: Soft, gravid, appropriate for gestational age.  Pain/Pressure: Absent     Pelvic: Cervical exam deferred        Extremities: Normal range of motion.  Edema: None  Mental Status:  Normal mood and affect. Normal behavior. Normal judgment and thought content.   Assessment and Plan:  Pregnancy: [id number] at 6671w2d  1. Supervision of high risk pregnancy, antepartum, third trimester Patient is doing  well   2. AMA (advanced maternal age) multigravida 35+, third trimester   3. Polyhydramnios in third trimester complication, single or unspecified fetus BPP 8/10 (-2 for breathing) today Continue weekly fetal testing  4. 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 Pregnancy complicated by previous preterm labor, third trimester Continue weekly 17-P  6. History of C[facility]section [facility]  7. Gestational diabetes mellitus (GDM) in third trimester controlled on oral hypoglycemic drug Currently on Metfomin CBGs reviewed and all within range  Preterm labor symptoms and general obstetric precautions including but not limited to vaginal bleeding, contractions, leaking of fluid and fetal movement were reviewed in detail with the patient. Please refer to After Visit Summary for other counseling recommendations.  No Follow-up on file.   Catalina AntiguaPeggy Geraldin Habermehl, MD

## 2017-04-03 NOTE — Progress Notes (Signed)
ROB/17P. Wants the FLU vaccine.

## 2017-04-03 NOTE — Progress Notes (Signed)
Administrations This Visit    hydroxyprogesterone caproate (MAKENA) 250 mg/mL injection 250 mg    Admin Date 04/03/2017 Action Given Dose 250 mg Route Intramuscular Administered By Maretta BeesMcGlashan, Carol J, RMA          FLU given in Left deltoid.

## 2017-04-03 NOTE — Procedures (Signed)
Annette Lambert 10/31/1980 5756w2d  Fetus A Non-Stress Test Interpretation for 04/03/17  Indication: Polyhydramnios  Fetal Heart Rate A Mode: External Baseline Rate (A): 130 bpm Variability: Moderate Accelerations: 15 x 15 Decelerations: None  Uterine Activity Mode: Toco Contraction Frequency (min): Irreg UC noted Contraction Duration (sec): 60-110 Contraction Quality: Mild Resting Tone Palpated: Relaxed Resting Time: Adequate  Interpretation (Fetal Testing) Nonstress Test Interpretation: Reactive Comments: FHR tracing rev'd by Dr. Ezzard StandingNewman

## 2017-04-03 NOTE — Addendum Note (Signed)
Addended by: Maretta BeesMCGLASHAN, CAROL J on: 04/03/2017 01:33 PM   Modules accepted: Orders

## 2017-04-10 ENCOUNTER — Ambulatory Visit (INDEPENDENT_AMBULATORY_CARE_PROVIDER_SITE_OTHER): Payer: 59 | Admitting: Obstetrics & Gynecology

## 2017-04-10 VITALS — BP 108/69 | HR 95 | Wt 175.6 lb

## 2017-04-10 DIAGNOSIS — O0993 Supervision of high risk pregnancy, unspecified, third trimester: Secondary | ICD-10-CM

## 2017-04-10 DIAGNOSIS — O09213 Supervision of pregnancy with history of pre-term labor, third trimester: Secondary | ICD-10-CM | POA: Diagnosis not present

## 2017-04-10 DIAGNOSIS — O403XX0 Polyhydramnios, third trimester, not applicable or unspecified: Secondary | ICD-10-CM | POA: Diagnosis not present

## 2017-04-10 DIAGNOSIS — Z148 Genetic carrier of other disease: Secondary | ICD-10-CM

## 2017-04-10 NOTE — Patient Instructions (Signed)
Labor Induction Labor induction is when steps are taken to cause a pregnant woman to begin the labor process. Most women go into labor on their own between 37 weeks and 42 weeks of the pregnancy. When this does not happen or when there is a medical need, methods may be used to induce labor. Labor induction causes a pregnant woman's uterus to contract. It also causes the cervix to soften (ripen), open (dilate), and thin out (efface). Usually, labor is not induced before 39 weeks of the pregnancy unless there is a problem with the baby or mother. Before inducing labor, your health care provider will consider a number of factors, including the following:  The medical condition of you and the baby.  How many weeks along you are.  The status of the baby's lung maturity.  The condition of the cervix.  The position of the baby. What are the reasons for labor induction? Labor may be induced for the following reasons:  The health of the baby or mother is at risk.  The pregnancy is overdue by 1 week or more.  The water breaks but labor does not start on its own.  The mother has a health condition or serious illness, such as high blood pressure, infection, placental abruption, or diabetes.  The amniotic fluid amounts are low around the baby.  The baby is distressed. Convenience or wanting the baby to be born on a certain date is not a reason for inducing labor. What methods are used for labor induction? Several methods of labor induction may be used, such as:  Prostaglandin medicine. This medicine causes the cervix to dilate and ripen. The medicine will also start contractions. It can be taken by mouth or by inserting a suppository into the vagina.  Inserting a thin tube (catheter) with a balloon on the end into the vagina to dilate the cervix. Once inserted, the balloon is expanded with water, which causes the cervix to open.  Stripping the membranes. Your health care provider separates  amniotic sac tissue from the cervix, causing the cervix to be stretched and causing the release of a hormone called progesterone. This may cause the uterus to contract. It is often done during an office visit. You will be sent home to wait for the contractions to begin. You will then come in for an induction.  Breaking the water. Your health care provider makes a hole in the amniotic sac using a small instrument. Once the amniotic sac breaks, contractions should begin. This may still take hours to see an effect.  Medicine to trigger or strengthen contractions. This medicine is given through an IV access tube inserted into a vein in your arm. All of the methods of induction, besides stripping the membranes, will be done in the hospital. Induction is done in the hospital so that you and the baby can be carefully monitored. How long does it take for labor to be induced? Some inductions can take up to 2-3 days. Depending on the cervix, it usually takes less time. It takes longer when you are induced early in the pregnancy or if this is your first pregnancy. If a mother is still pregnant and the induction has been going on for 2-3 days, either the mother will be sent home or a cesarean delivery will be needed. What are the risks associated with labor induction? Some of the risks of induction include:  Changes in fetal heart rate, such as too high, too low, or erratic.  Fetal distress.    Chance of infection for the mother and baby.  Increased chance of having a cesarean delivery.  Breaking off (abruption) of the placenta from the uterus (rare).  Uterine rupture (very rare). When induction is needed for medical reasons, the benefits of induction may outweigh the risks. What are some reasons for not inducing labor? Labor induction should not be done if:  It is shown that your baby does not tolerate labor.  You have had previous surgeries on your uterus, such as a myomectomy or the removal of  fibroids.  Your placenta lies very low in the uterus and blocks the opening of the cervix (placenta previa).  Your baby is not in a head-down position.  The umbilical cord drops down into the birth canal in front of the baby. This could cut off the baby's blood and oxygen supply.  You have had a previous cesarean delivery.  There are unusual circumstances, such as the baby being extremely premature. This information is not intended to replace advice given to you by your health care provider. Make sure you discuss any questions you have with your health care provider. Document Released: 12/05/2006 Document Revised: 12/22/2015 Document Reviewed: 02/12/2013 Elsevier Interactive Patient Education  2017 [employer]vier Inc[employer]  

## 2017-04-10 NOTE — Progress Notes (Signed)
   PRENATAL VISIT NOTE  Subjective:  Annette Lambert is a 36 y.o. [id number] at 445w2d being seen today for ongoing prenatal care.  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  Patient reports no complaints.  Contractions: Not present. Vag. Bleeding: None.  Movement: Present. Denies leaking of fluid.   The following portions of the patient's history were reviewed and updated as appropriate: allergies, current medications, past family history, past medical history, past social history, past surgical history and problem list. Problem list updated.  Objective:   Vitals:   04/10/17 1034  BP: 108/69  Pulse: 95  Weight: 175 lb 9.6 oz (79.7 kg)    Fetal Status: Fetal Heart Rate (bpm): 141 (Simultaneous filing. User may not have seen previous data.) Fundal Height: 39 cm Movement: Present     General:  Alert, oriented and cooperative. Patient is in no acute distress.  Skin: Skin is warm and dry. No rash noted.   Cardiovascular: Normal heart rate noted  Respiratory: Normal respiratory effort, no problems with respiration noted  Abdomen: Soft, gravid, appropriate for gestational age.  Pain/Pressure: Absent     Pelvic: Cervical exam deferred        Extremities: Normal range of motion.  Edema: None  Mental Status:  Normal mood and affect. Normal behavior. Normal judgment and thought content.   Assessment and Plan:  Pregnancy: [id number] at 6145w2d  1.  Supervision of high risk pregnancy, antepartum, third trimester US and BPP tomorrow in [facility]  2. 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 Polyhydramnios in third trimester complication, single or unspecified fetus   Preterm labor symptoms and general obstetric precautions including but not limited to vaginal bleeding, contractions, leaking of fluid and fetal movement were reviewed in detail with the patient. Please refer to After Visit Summary for other counseling recommendations.  Return in about 1 week (around 04/17/2017).   Scheryl DarterJames Colena Ketterman, MD

## 2017-04-10 NOTE — Progress Notes (Signed)
ROB/17P given. Tolerated well.  Administrations This Visit    hydroxyprogesterone caproate (MAKENA) 250 mg/mL injection 250 mg    Admin Date 04/10/2017 Action Given Dose 250 mg Route Intramuscular Administered By Maretta BeesMcGlashan, Salene Mohamud J, RMA

## 2017-04-11 ENCOUNTER — Ambulatory Visit (HOSPITAL_COMMUNITY)
Admission: RE | Admit: 2017-04-11 | Discharge: 2017-04-11 | Disposition: A | Discharge status: Home / Self Care | Payer: 59 | Source: Ambulatory Visit | Attending: Obstetrics & Gynecology | Admitting: Obstetrics & Gynecology

## 2017-04-11 ENCOUNTER — Encounter (HOSPITAL_COMMUNITY): Payer: Self-pay

## 2017-04-11 ENCOUNTER — Other Ambulatory Visit (HOSPITAL_COMMUNITY): Payer: Self-pay | Admitting: Maternal and Fetal Medicine

## 2017-04-11 DIAGNOSIS — O09293 Supervision of pregnancy with other poor reproductive or obstetric history, third trimester: Secondary | ICD-10-CM

## 2017-04-11 DIAGNOSIS — O09523 Supervision of elderly multigravida, third trimester: Secondary | ICD-10-CM

## 2017-04-11 DIAGNOSIS — O359XX0 Maternal care for (suspected) fetal abnormality and damage, unspecified, not applicable or unspecified: Secondary | ICD-10-CM

## 2017-04-11 DIAGNOSIS — E71110 Isovaleric acidemia: Secondary | ICD-10-CM | POA: Insufficient documentation

## 2017-04-11 DIAGNOSIS — O09219 Supervision of pregnancy with history of pre-term labor, unspecified trimester: Secondary | ICD-10-CM | POA: Diagnosis not present

## 2017-04-11 DIAGNOSIS — O403XX0 Polyhydramnios, third trimester, not applicable or unspecified: Secondary | ICD-10-CM

## 2017-04-11 DIAGNOSIS — O09299 Supervision of pregnancy with other poor reproductive or obstetric history, unspecified trimester: Secondary | ICD-10-CM | POA: Diagnosis not present

## 2017-04-11 DIAGNOSIS — O34219 Maternal care for unspecified type scar from previous cesarean delivery: Secondary | ICD-10-CM | POA: Insufficient documentation

## 2017-04-11 DIAGNOSIS — O24415 Gestational diabetes mellitus in pregnancy, controlled by oral hypoglycemic drugs: Secondary | ICD-10-CM | POA: Insufficient documentation

## 2017-04-11 DIAGNOSIS — Z3A35 35 weeks gestation of pregnancy: Secondary | ICD-10-CM

## 2017-04-11 DIAGNOSIS — O409XX0 Polyhydramnios, unspecified trimester, not applicable or unspecified: Secondary | ICD-10-CM

## 2017-04-17 ENCOUNTER — Other Ambulatory Visit: Payer: Self-pay | Admitting: Obstetrics & Gynecology

## 2017-04-17 ENCOUNTER — Telehealth: Payer: Self-pay | Admitting: *Deleted

## 2017-04-17 ENCOUNTER — Other Ambulatory Visit: Payer: Self-pay | Admitting: *Deleted

## 2017-04-17 DIAGNOSIS — G8918 Other acute postprocedural pain: Secondary | ICD-10-CM | POA: Insufficient documentation

## 2017-04-17 MED ORDER — OXYCODONE-ACETAMINOPHEN 5-325 MG PO TABS: 1.0000 | ORAL_TABLET | Freq: Four times a day (QID) | ORAL | 0 refills | Status: DC | PRN

## 2017-04-17 NOTE — Telephone Encounter (Signed)
Patient's husband is calling for her- she delivered last Friday- 9/14 by C-section and she is requesting a refill of her pain medication. Patient was given Oxycodone 5 mg and she is taking 4/day. She was given #15 at discharge. I told the husband that is normally not a medication that we refill- but I will check with the provider for him.

## 2017-04-19 ENCOUNTER — Encounter: Payer: 59 | Admitting: Obstetrics

## 2017-04-22 NOTE — Telephone Encounter (Signed)
error 

## 2017-05-27 ENCOUNTER — Ambulatory Visit: Payer: Medicaid Other | Admitting: Obstetrics

## 2017-08-05 ENCOUNTER — Encounter (HOSPITAL_COMMUNITY): Payer: Self-pay

## 2018-11-10 IMAGING — US US MFM OB FOLLOW-UP
1 series · 13 of 28 positions shown · non-contrast
Comparison: none

[Series 1: us mfm ob follow-up · 13 of 58 slices shown]
[im 3/58]
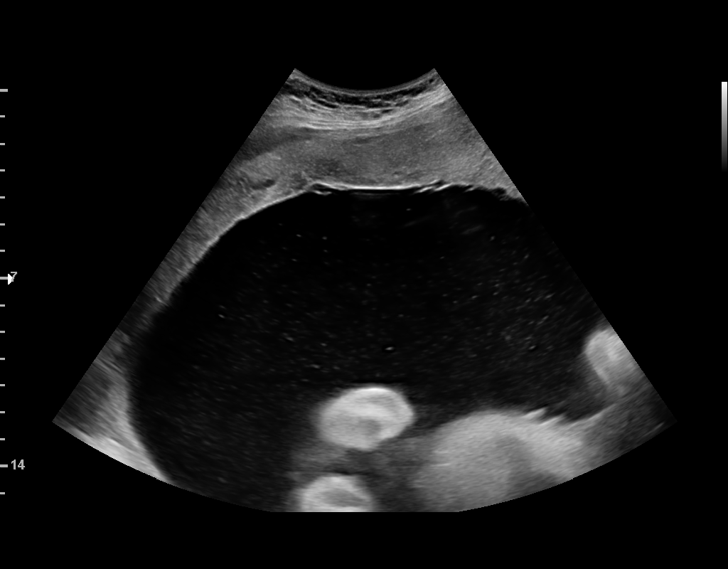
[im 7/58]
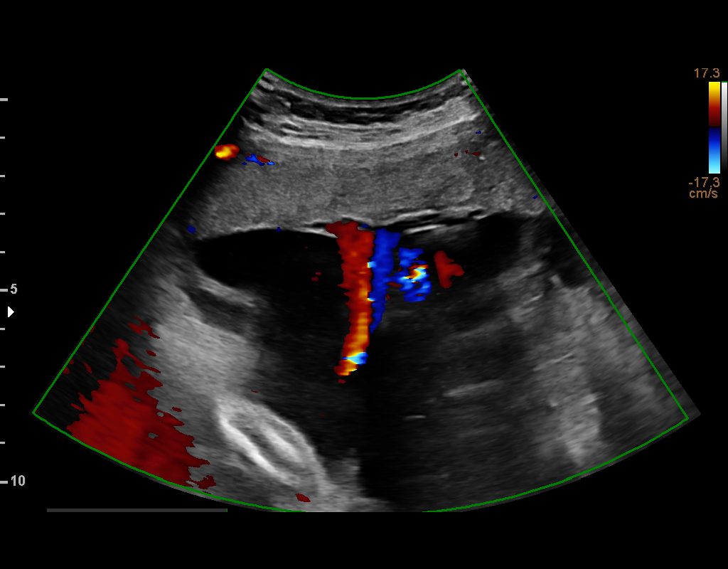
[im 11/58]
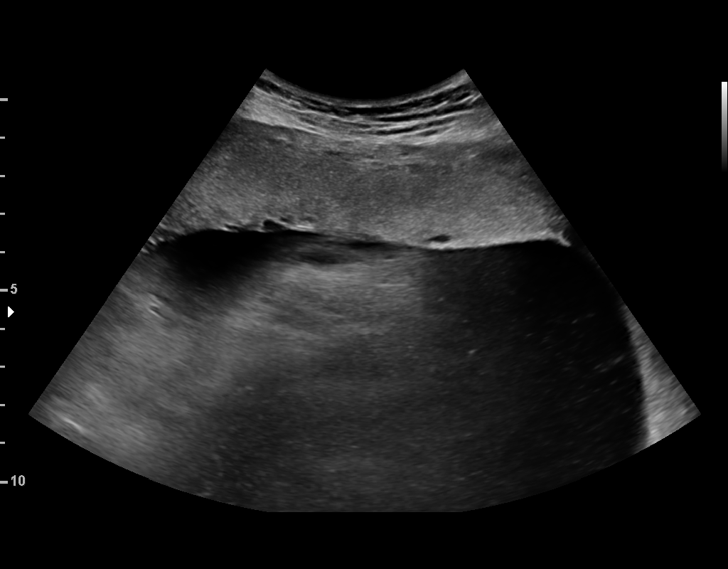
[im 15/58]
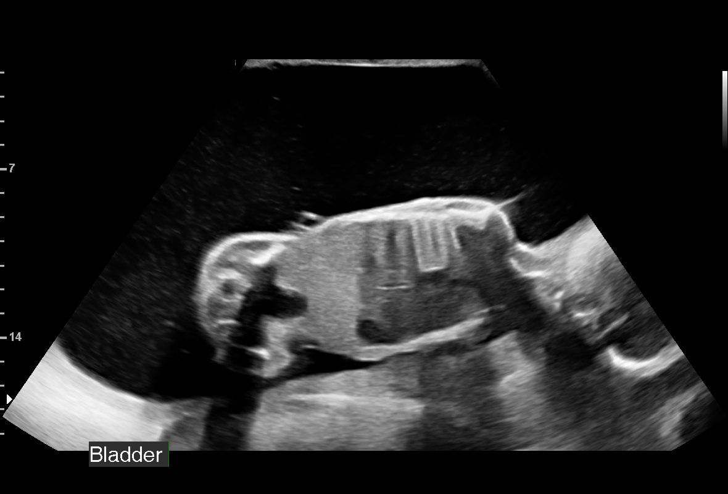
[im 20/58]
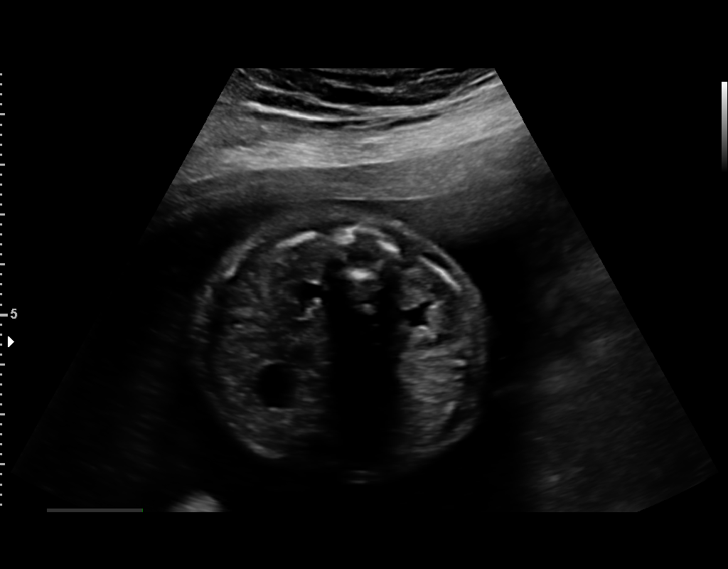
[im 24/58]
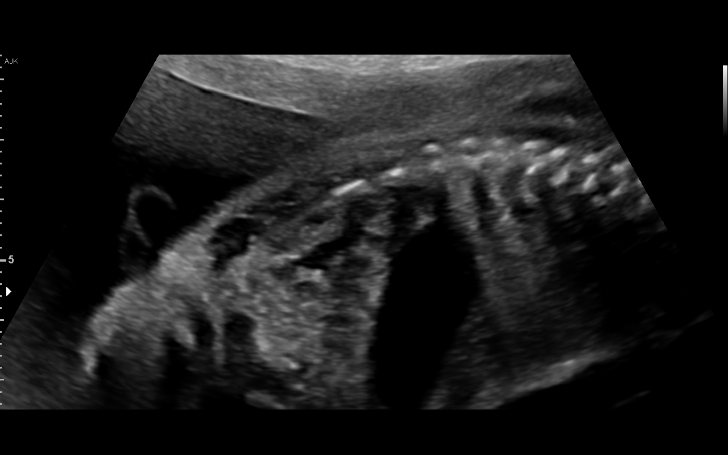
[im 30/58]
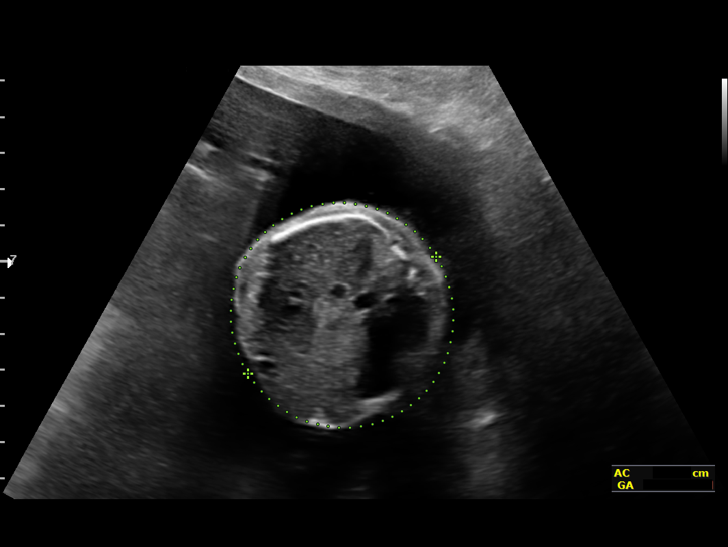
[im 34/58]
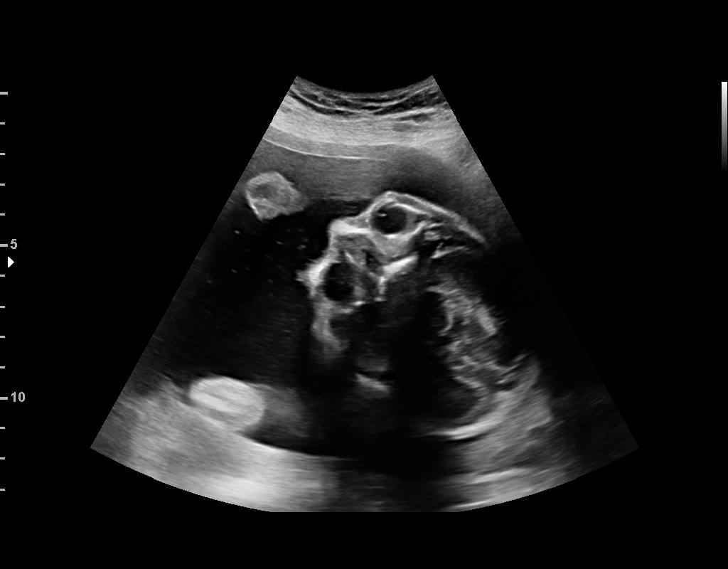
[im 39/58]
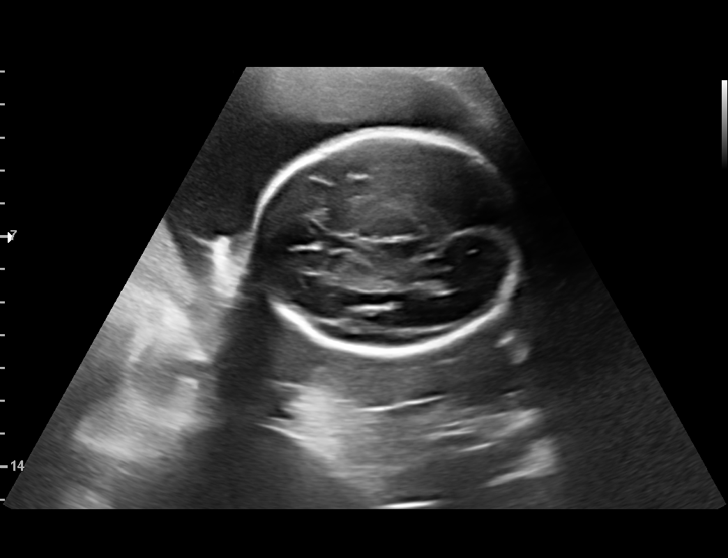
[im 43/58]
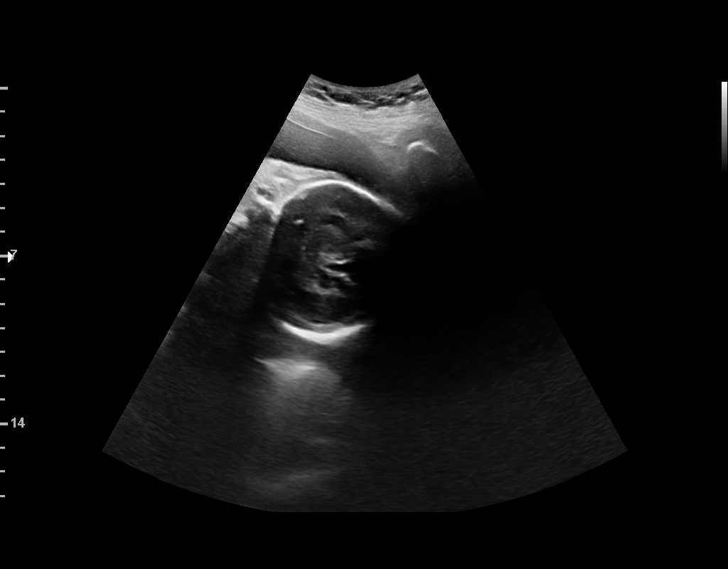
[im 47/58]
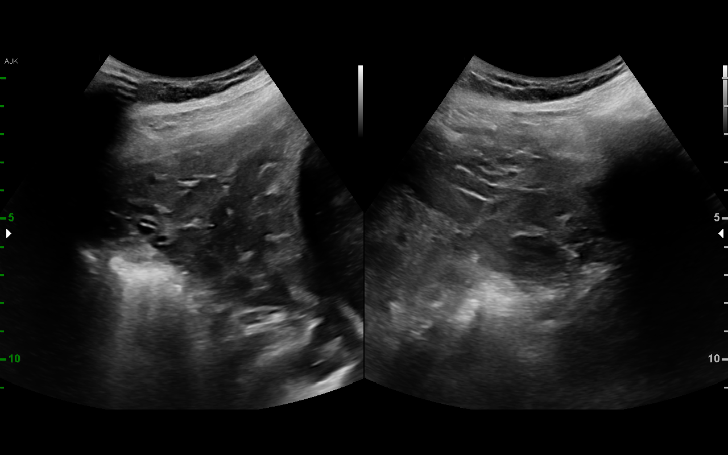
[im 51/58]
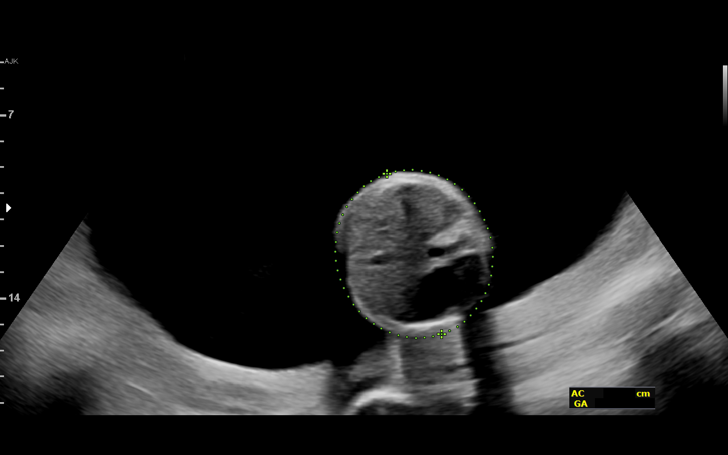
[im 55/58]
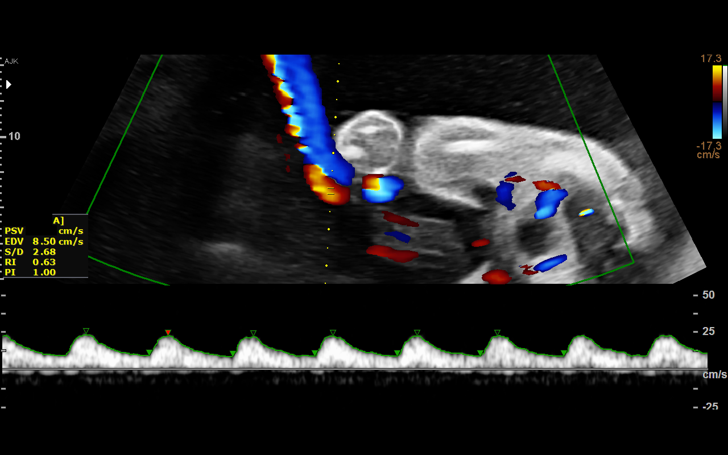

[13 of 28 positions shown; findings below may reference images not displayed]

Road [HOSPITAL]

1  [facility] S[facility]ENG              [id number]      [id number]     [id number]
2  HO SING HENG              [id number]      [id number]     [id number]
Indications

26 weeks gestation of pregnancy
Poor obstetric history: Previous neonatal
death due to renal failure
Poor obstetric history: Previous preterm
delivery, antepartum due to poly, possible
abruption
Previous cesarean delivery, antepartum
Advanced maternal age multigravida 36,
second trimester-neg NIPS
Polyhydramnios, second trimester,
antepartum condition or complication, fetus 1
History of polyhydramnios
OB History

Blood Type:            Height:  5'7"   Weight (lb):  160       BMI:
Gravidity:    6         Term:   3        Prem:   1        SAB:   1
TOP:          0       Ectopic:  0        Living: 4
Fetal Evaluation

Num Of Fetuses:     1
Fetal Heart         136
Rate(bpm):
Cardiac Activity:   Observed
Presentation:       Cephalic
Placenta:           Anterior, above cervical os
P. Cord Insertion:  Visualized, central
Amniotic Fluid
AFI FV:      Polyhydramnios

Largest Pocket(cm)
14.02
Biometry

BPD:        66  mm     G. Age:  26w 4d         57  %    CI:        75.92   %    70 - 86
FL/HC:      19.2   %    18.6 -
HC:      240.1  mm     G. Age:  26w 1d         23  %    HC/AC:      1.22        1.04 -
AC:      197.6  mm     G. Age:  24w 3d          6  %    FL/BPD:     70.0   %    71 - 87
FL:       46.2  mm     G. Age:  25w 3d         16  %    FL/AC:      23.4   %    20 - 24

Est. FW:     762  gm    1 lb 11 oz      28  %
Gestational Age

LMP:           26w 1d        Date:  08/06/16                 EDD:   05/13/17
U/S Today:     25w 5d                                        EDD:   05/16/17
Best:          26w 1d     Det. By:  LMP  (08/06/16)          EDD:   05/13/17
Doppler - Fetal Vessels

Umbilical Artery
S/D     %tile     RI              PI              PSV    ADFV    RDFV
(cm/s)
3.74       70   0.73             1.28             32.23      No      No

Cervix Uterus Adnexa

Cervix
Not adaquately visualized

Uterus
No abnormality visualized.
Comments

Patient was offerred amnioreduction due to symptomatic
polyhydramnios - she declined today.  Will begin weekly
evaluations and would offer serial amnioreductions as
needed with the understanding that this is only for temporary
relief of symptoms.
Impression

Single IUP at 26w 1d
Previous baby born at 32 weeks with polyhydramnios,
abruption and PPROM; baby died at [place] at 9 days of age
with Isovaleric acidemia
The estimated fetal weight today is at the 28th %tile.  The AC
measures at the 6th %tile.
UA Doppler studies normal for gestational age
Polyhydramnios noted - MVP 14 cm.
Recommendations

Will begin weekly ultrasounds due to polyhydramnios
Ultrasound for growth in 3 weeks

## 2020-06-16 ENCOUNTER — Ambulatory Visit (HOSPITAL_COMMUNITY)
Admission: EM | Admit: 2020-06-16 | Discharge: 2020-06-16 | Disposition: A | Discharge status: Home / Self Care | Payer: 59 | Attending: Emergency Medicine | Admitting: Emergency Medicine

## 2020-06-16 ENCOUNTER — Encounter (HOSPITAL_COMMUNITY): Payer: Self-pay | Admitting: Emergency Medicine

## 2020-06-16 ENCOUNTER — Other Ambulatory Visit: Payer: Self-pay

## 2020-06-16 DIAGNOSIS — R21 Rash and other nonspecific skin eruption: Secondary | ICD-10-CM | POA: Insufficient documentation

## 2020-06-16 DIAGNOSIS — N898 Other specified noninflammatory disorders of vagina: Secondary | ICD-10-CM | POA: Diagnosis not present

## 2020-06-16 DIAGNOSIS — Z3202 Encounter for pregnancy test, result negative: Secondary | ICD-10-CM

## 2020-06-16 LAB — POCT URINALYSIS DIPSTICK, ED / UC
Bilirubin Urine: NEGATIVE
Glucose, UA: NEGATIVE mg/dL
Ketones, ur: NEGATIVE mg/dL
Leukocytes,Ua: NEGATIVE
Nitrite: NEGATIVE
Protein, ur: NEGATIVE mg/dL
Specific Gravity, Urine: 1.02 (ref 1.005–1.030)
Urobilinogen, UA: 0.2 mg/dL (ref 0.0–1.0)
pH: 7 (ref 5.0–8.0)

## 2020-06-16 LAB — POC URINE PREG, ED: Preg Test, Ur: NEGATIVE

## 2020-06-16 MED ORDER — FLUCONAZOLE 150 MG PO TABS: 150.0000 mg | ORAL_TABLET | Freq: Once | ORAL | 0 refills | Status: AC

## 2020-06-16 MED ORDER — TRIAMCINOLONE ACETONIDE 0.1 % EX CREA: 1.0000 "application " | TOPICAL_CREAM | Freq: Two times a day (BID) | CUTANEOUS | 0 refills | Status: AC

## 2020-06-16 NOTE — ED Triage Notes (Signed)
Itchiness to neck, arm and axilla.  Onset 3 weeks ago  Itchiness and  odor vaginally.  Onset of symptoms 2 months ago.

## 2020-06-16 NOTE — ED Provider Notes (Signed)
[facility]    CSN: [id number] Arrival date & time: 06/16/20  9935      History   Chief Complaint Chief Complaint  Patient presents with  . Rash    HPI Annette Lambert is a 39 y.o. female presenting today for evaluation of rash and vaginal itching and odor.  Reports over the past 3 weeks she has noticed some itching to her neck arms as well as axilla. Reports dry rash. Applying vaseline.  Vaseline has helped.  Denies history of similar prior to 3 weeks ago.  Denies associated pain.  Denies any new soaps lotions detergents or hygiene products.  Denies change in deodorant.  Also reports associated vaginal odor and itching which has been going on for approximately 2 months. Vaginal Discharge with dark in color. Has had associated pain which is most noticeable around menstrual cycle.  Last menstrual cycle was 06/12/2020. Denies history of BV/yeast. Reports 1 partner, no concerns for STD.   HPI  Past Medical History:  Diagnosis Date  . Gestational diabetes   . Gestational diabetes mellitus (GDM)     Patient Active Problem List   Diagnosis Date Noted  . Postoperative pain 04/17/2017  . Preterm uterine contractions in third trimester, antepartum 02/22/2017  . Gestational diabetes mellitus (GDM) in third trimester 02/15/2017  . Polyhydramnios in third trimester 02/14/2017  . Hereditary disease in family possibly affecting fetus, fetus 2   . 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 Pregnancy complicated by previous preterm labor, third trimester 10/30/2016  . Supervision of high risk pregnancy, antepartum, third trimester 10/25/2016  . AMA (advanced maternal age) multigravida 53+, third trimester 10/25/2016  . Limited English speaking patient 10/25/2016  . History of C-section 10/25/2016    Past Surgical History:  Procedure Laterality Date  . CESAREAN SECTION N/A 04/09/2016   Procedure: CESAREAN SECTION;  Surgeon: Truett Mainland, DO;  Location: [facility];   Service: Obstetrics;  Laterality: N/A;  . TUBAL LIGATION      OB History    Gravida  6   Para  4   Term  3   Preterm  1   AB  1   Living  3     SAB  1   TAB  0   Ectopic  0   Multiple  0   Live Births  4            Home Medications    Prior to Admission medications   Medication Sig Start Date End Date Taking? Authorizing Provider  ACCU-CHEK FASTCLIX LANCETS MISC 1 Device by Percutaneous route 4 (four) times daily. 02/22/17   Anyanwu, Sallyanne Havers, MD  acetaminophen (TYLENOL) 500 MG tablet Take 500 mg by mouth every 6 (six) hours as needed for headache.    [provider]  Blood Glucose Monitoring Suppl (ACCU-CHEK GUIDE) w/Device KIT 1 kit by Does not apply route 4 (four) times daily. 02/22/17   Anyanwu, Sallyanne Havers, MD  doxylamine, Sleep, (UNISOM) 25 MG tablet Take 25 mg by mouth at bedtime as needed for sleep.     [provider]  fluconazole (DIFLUCAN) 150 MG tablet Take 1 tablet (150 mg total) by mouth once for 1 dose. 06/16/20 06/16/20  Jeremiyah Cullens C, PA-C  glucose blood (ACCU-CHEK GUIDE) test strip 4 time daily 02/22/17   Anyanwu, Ugonna A, MD  triamcinolone (KENALOG) 0.1 % Apply 1 application topically 2 (two) times daily. 06/16/20   Nawaf Strange C, PA-C  metFORMIN (GLUCOPHAGE) 500  MG tablet Take 1 tablet (500 mg total) by mouth 2 (two) times daily with a meal. 02/23/17 06/16/20  Constant, Peggy, MD    Family History History reviewed. No pertinent family history.  Social History Social History   Tobacco Use  . Smoking status: Never Smoker  . Smokeless tobacco: Never Used  Substance Use Topics  . Alcohol use: No    Alcohol/week: 0.0 standard drinks  . Drug use: No     Allergies   Patient has no known allergies.   Review of Systems Review of Systems  Constitutional: Negative for fever.  Respiratory: Negative for shortness of breath.   Cardiovascular: Negative for chest pain.  Gastrointestinal: Negative for abdominal pain,  diarrhea, nausea and vomiting.  Genitourinary: Positive for vaginal discharge. Negative for dysuria, flank pain, genital sores, hematuria, menstrual problem, vaginal bleeding and vaginal pain.  Musculoskeletal: Negative for back pain.  Skin: Negative for rash.  Neurological: Negative for dizziness, light-headedness and headaches.     Physical Exam Triage Vital Signs ED Triage Vitals  Enc Vitals Group     BP      Pulse      Resp      Temp      Temp src      SpO2      Weight      Height      Head Circumference      Peak Flow      Pain Score      Pain Loc      Pain Edu?      Excl. in [place]?    No data found.  Updated Vital Signs BP 128/75 (BP Location: Left Arm)   Pulse 65   Temp 98.3 F (36.8 C) (Oral)   Resp 18   LMP 06/12/2020   SpO2 97%   Visual Acuity Right Eye Distance:   Left Eye Distance:   Bilateral Distance:    Right Eye Near:   Left Eye Near:    Bilateral Near:     Physical Exam Vitals and nursing note reviewed.  Constitutional:      Appearance: She is well-developed.     Comments: No acute distress  HENT:     Head: Normocephalic and atraumatic.     Nose: Nose normal.  Eyes:     Conjunctiva/sclera: Conjunctivae normal.  Cardiovascular:     Rate and Rhythm: Normal rate.  Pulmonary:     Effort: Pulmonary effort is normal. No respiratory distress.  Abdominal:     General: There is no distension.  Musculoskeletal:        General: Normal range of motion.     Cervical back: Neck supple.  Skin:    General: Skin is warm and dry.     Comments: Left anterior neck with area of dry appearing scaling with hyperpigmentation, no erythema or induration, no drainage  Anterior superior neck with mildly papular bumps noted and mild erythema, no induration or warmth  Neurological:     Mental Status: She is alert and oriented to person, place, and time.      UC Treatments / Results  Labs (all labs ordered are listed, but only abnormal results are  displayed) Labs Reviewed  POCT URINALYSIS DIPSTICK, ED / UC - Abnormal; Notable for the following components:      Result Value   Hgb urine dipstick TRACE (*)    All other components within normal limits  POC URINE PREG, ED  CERVICOVAGINAL ANCILLARY ONLY  EKG   Radiology No results found.  Procedures Procedures (including critical care time)  Medications Ordered in UC Medications - No data to display  Initial Impression / Assessment and Plan / UC Course  I have reviewed the triage vital signs and the nursing notes.  Pertinent labs & imaging results that were available during my care of the patient were reviewed by me and considered in my medical decision making (see chart for details).     1.  Rash most noticeable eczema-recommending triamcinolone topically twice daily with continued moisturizer efforts.  Avoid any irritants to this area.  2.  Vaginal discharge-questionable BV or yeast, will empirically treat for yeast today with Diflucan and will defer Flagyl until confirmation as she does not have a history of this.  Pregnancy negative, UA unremarkable.  Will alter therapy as needed with results of vaginal swab.  Discussed strict return precautions. Patient verbalized understanding and is agreeable with plan.  Final Clinical Impressions(s) / UC Diagnoses   Final diagnoses:  Rash and nonspecific skin eruption  Vaginal discharge     Discharge Instructions     Rash Triamcinolone cream twice daily to rash Apply thicker moistuzers- vaseline, aquaphor, enucerin, cera ve  Vaginal discharge 1 tab of diflucan today, repeat in 72 hours if still having symptoms and swab returns positive for yeast We will call with results and change medicines if needed  Follow up if any symptoms not improving or worsening, follow up for re-evaluation    ED Prescriptions    Medication Sig Dispense Auth. Provider   fluconazole (DIFLUCAN) 150 MG tablet Take 1 tablet (150 mg total) by  mouth once for 1 dose. 2 tablet Amando Ishikawa C, PA-C   triamcinolone (KENALOG) 0.1 % Apply 1 application topically 2 (two) times daily. 80 g Nickolaus Bordelon, Dur[place] C, PA-C     PDMP not reviewed this encounter.   Janith Lima, PA-C 06/16/20 1119

## 2020-06-16 NOTE — Discharge Instructions (Signed)
Rash Triamcinolone cream twice daily to rash Apply thicker moistuzers- vaseline, aquaphor, enucerin, cera ve  Vaginal discharge 1 tab of diflucan today, repeat in 72 hours if still having symptoms and swab returns positive for yeast We will call with results and change medicines if needed  Follow up if any symptoms not improving or worsening, follow up for re-evaluation

## 2020-06-17 LAB — CERVICOVAGINAL ANCILLARY ONLY
Bacterial Vaginitis (gardnerella): NEGATIVE
Candida Glabrata: NEGATIVE
Candida Vaginitis: POSITIVE — AB
Chlamydia: NEGATIVE
Comment: NEGATIVE
Comment: NEGATIVE
Comment: NEGATIVE
Comment: NEGATIVE
Comment: NEGATIVE
Comment: NORMAL
Neisseria Gonorrhea: NEGATIVE
Trichomonas: NEGATIVE

## 2023-02-04 ENCOUNTER — Other Ambulatory Visit: Payer: Self-pay

## 2023-02-04 ENCOUNTER — Encounter (HOSPITAL_COMMUNITY): Payer: Self-pay | Admitting: *Deleted

## 2023-02-04 ENCOUNTER — Ambulatory Visit (HOSPITAL_COMMUNITY)
Admission: EM | Admit: 2023-02-04 | Discharge: 2023-02-04 | Disposition: A | Discharge status: Home / Self Care | Payer: Medicaid Other | Attending: Nurse Practitioner | Admitting: Nurse Practitioner

## 2023-02-04 DIAGNOSIS — J020 Streptococcal pharyngitis: Secondary | ICD-10-CM

## 2023-02-04 LAB — POCT RAPID STREP A (OFFICE): Rapid Strep A Screen: POSITIVE — AB

## 2023-02-04 MED ORDER — AMOXICILLIN 500 MG PO CAPS: 500.0000 mg | ORAL_CAPSULE | Freq: Two times a day (BID) | ORAL | 0 refills | Status: AC

## 2023-02-04 NOTE — ED Triage Notes (Signed)
Pt reports for the past 2 days she has had a sore throat ,fever and HA. Pt has not had any known sick contacts.

## 2023-02-04 NOTE — Discharge Instructions (Addendum)
You have strep throat.  This is causing your sore throat, fever, headache, and abdominal pain.  Take the amoxicillin exactly as prescribed to treat it.  Even if you start feeling better, make sure you take all of the medicine.  You can take Tylenol or ibuprofen to help with the fever and bodyaches.  You are considered contagious until you have been on the amoxicillin for 24 hours so avoid sharing drinks with anybody or kissing anybody until you have been on the medicine for 24 hours.  Change your toothbrush today or tomorrow to prevent reinfection to yourself.

## 2023-02-04 NOTE — ED Provider Notes (Signed)
[facility]    CSN: [id number] Arrival date & time: 02/04/23  7829      History   Chief Complaint Chief Complaint  Patient presents with   Sore Throat   Headache   Fever    HPI Annette Lambert is a 42 y.o. female.   Patient presents today for 2-day history of fever, body aches and chills, sore throat, headache, and abdominal pain.  She denies cough, runny or stuffy nose, postnasal drainage, ear pain, nausea or vomiting, diarrhea, loss of taste or smell, new rash.  Reports it has been difficult to eat and swallow because of the throat pain and she is also felt more tired than normal.  No known sick contacts.  Has taken Tylenol which seems to help temporarily with the fever symptoms.  Patient denies antibiotic use in the past 90 days.    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   Pregnancy complicated by previous preterm labor, third trimester 10/30/2016   Supervision of high risk pregnancy, antepartum, third trimester 10/25/2016   AMA (advanced maternal age) multigravida 35+, third trimester 10/25/2016   Limited English speaking patient 10/25/2016   History of C-section 10/25/2016    Past Surgical History:  Procedure Laterality Date   CESAREAN SECTION N/A 04/09/2016   Procedure: CESAREAN SECTION;  Surgeon: Levie Heritage, DO;  Location: [facility];  Service: Obstetrics;  Laterality: N/A;   TUBAL LIGATION      OB History     Gravida  6   Para  4   Term  3   Preterm  1   AB  1   Living  3      SAB  1    IAB  0   Ectopic  0   Multiple  0   Live Births  4            Home Medications    Prior to Admission medications   Medication Sig Start Date End Date Taking? Authorizing Provider  acetaminophen (TYLENOL) 500 MG tablet Take 500 mg by mouth every 6 (six) hours as needed for headache.   Yes [provider]  amoxicillin (AMOXIL) 500 MG capsule Take 1 capsule (500 mg total) by mouth 2 (two) times daily for 10 days. 02/04/23 02/14/23 Yes Valentino Nose, NP  ACCU-CHEK FASTCLIX LANCETS MISC 1 Device by Percutaneous route 4 (four) times daily. 02/22/17   Anyanwu, Jethro Bastos, MD  Blood Glucose Monitoring Suppl (ACCU-CHEK GUIDE) w/Device KIT 1 kit by Does not apply route 4 (four) times daily. 02/22/17   Anyanwu, Jethro Bastos, MD  doxylamine, Sleep, (UNISOM) 25 MG tablet Take 25 mg by mouth at bedtime as needed for sleep.     [provider]  glucose blood (ACCU-CHEK GUIDE) test strip 4 time daily 02/22/17   Anyanwu, Ugonna A, MD  triamcinolone (KENALOG) 0.1 % Apply 1 application topically 2 (two) times daily. 06/16/20   Wieters, Hallie C, PA-C  metFORMIN (GLUCOPHAGE) 500 MG tablet Take 1 tablet (500 mg total) by mouth 2 (two) times daily  with a meal. 02/23/17 06/16/20  Constant, Peggy, MD    Family History History reviewed. No pertinent family history.  Social History Social History   Tobacco Use   Smoking status: Never   Smokeless tobacco: Never  Substance Use Topics   Alcohol use: No    Alcohol/week: 0.0 standard drinks of alcohol   Drug use: No     Allergies   Patient has no known allergies.   Review of Systems Review of Systems Per HPI  Physical Exam Triage Vital Signs ED Triage Vitals  Enc Vitals Group     BP 02/04/23 1009 129/84     Pulse Rate 02/04/23 1009 87     Resp 02/04/23 1009 18     Temp 02/04/23 1009 98.6 F (37 C)     Temp src --      SpO2 02/04/23 1009 98 %     Weight --      Height --      Head Circumference --      Peak Flow --       Pain Score 02/04/23 1006 8     Pain Loc --      Pain Edu? --      Excl. in [place]? --    No data found.  Updated Vital Signs BP 129/84   Pulse 87   Temp 98.6 F (37 C)   Resp 18   LMP 12/17/2022   SpO2 98%   Visual Acuity Right Eye Distance:   Left Eye Distance:   Bilateral Distance:    Right Eye Near:   Left Eye Near:    Bilateral Near:     Physical Exam Vitals and nursing note reviewed.  Constitutional:      Appearance: She is well-developed.  HENT:     Head: Normocephalic and atraumatic.     Right Ear: Tympanic membrane and ear canal normal. No drainage, swelling or tenderness. No middle ear effusion. Tympanic membrane is not erythematous.     Left Ear: Tympanic membrane and ear canal normal. No swelling or tenderness.  No middle ear effusion. Tympanic membrane is not erythematous.     Nose: No congestion or rhinorrhea.     Mouth/Throat:     Mouth: Mucous membranes are moist.     Pharynx: Oropharynx is clear. No oropharyngeal exudate.     Tonsils: No tonsillar exudate. 2+ on the right. 2+ on the left.  Eyes:     Extraocular Movements:     Right eye: Normal extraocular motion.     Left eye: Normal extraocular motion.  Cardiovascular:     Rate and Rhythm: Normal rate and regular rhythm.  Pulmonary:     Effort: Pulmonary effort is normal. No respiratory distress.     Breath sounds: Normal breath sounds. No wheezing or rales.  Abdominal:     General: Bowel sounds are normal. There is no distension.     Palpations: Abdomen is soft.     Tenderness: There is no abdominal tenderness. There is no guarding or rebound.  Musculoskeletal:     Cervical back: Normal range of motion.  Lymphadenopathy:     Cervical: Cervical adenopathy present.  Skin:    General: Skin is warm and dry.     Capillary Refill: Capillary refill takes less than 2 seconds.     Coloration: Skin is not pale.     Findings: No erythema.  Neurological:     Mental Status: She is alert and oriented to  person, place,  and time.      UC Treatments / Results  Labs (all labs ordered are listed, but only abnormal results are displayed) Labs Reviewed  POCT RAPID STREP A (OFFICE) - Abnormal; Notable for the following components:      Result Value   Rapid Strep A Screen Positive (*)    All other components within normal limits    EKG   Radiology No results found.  Procedures Procedures (including critical care time)  Medications Ordered in UC Medications - No data to display  Initial Impression / Assessment and Plan / UC Course  I have reviewed the triage vital signs and the nursing notes.  Pertinent labs & imaging results that were available during my care of the patient were reviewed by me and considered in my medical decision making (see chart for details).   Patient is well-appearing, normotensive, afebrile, not tachycardic, not tachypneic, oxygenating well on room air.    1. Strep pharyngitis Treat with amoxicillin twice daily for 10 days Change toothbrush after starting treatment Discussed supportive care and contagious timeframe  The patient was given the opportunity to ask questions.  All questions answered to their satisfaction.  The patient is in agreement to this plan.    Final Clinical Impressions(s) / UC Diagnoses   Final diagnoses:  Strep pharyngitis     Discharge Instructions      You have strep throat.  This is causing your sore throat, fever, headache, and abdominal pain.  Take the amoxicillin exactly as prescribed to treat it.  Even if you start feeling better, make sure you take all of the medicine.  You can take Tylenol or ibuprofen to help with the fever and bodyaches.  You are considered contagious until you have been on the amoxicillin for 24 hours so avoid sharing drinks with anybody or kissing anybody until you have been on the medicine for 24 hours.  Change your toothbrush today or tomorrow to prevent reinfection to yourself.   ED  Prescriptions     Medication Sig Dispense Auth. Provider   amoxicillin (AMOXIL) 500 MG capsule Take 1 capsule (500 mg total) by mouth 2 (two) times daily for 10 days. 20 capsule Valentino Nose, NP      PDMP not reviewed this encounter.   Valentino Nose, NP 02/04/23 1120
